# Patient Record
Sex: Female | Born: 1995 | Hispanic: Yes | Marital: Single | Smoking: Current some day smoker
Health system: Southern US, Community
[De-identification: ages and names within clinical notes are randomized; demographics above are authoritative.]

## PROBLEM LIST (undated history)

## (undated) DIAGNOSIS — F32A Depression, unspecified: Secondary | ICD-10-CM

## (undated) DIAGNOSIS — F603 Borderline personality disorder: Secondary | ICD-10-CM

## (undated) DIAGNOSIS — F329 Major depressive disorder, single episode, unspecified: Secondary | ICD-10-CM

## (undated) DIAGNOSIS — J45909 Unspecified asthma, uncomplicated: Secondary | ICD-10-CM

---
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
## 2019-06-10 ENCOUNTER — Encounter: Payer: Self-pay | Admitting: Emergency Medicine

## 2019-06-10 ENCOUNTER — Other Ambulatory Visit: Payer: Self-pay

## 2019-06-10 DIAGNOSIS — R0789 Other chest pain: Secondary | ICD-10-CM | POA: Insufficient documentation

## 2019-06-10 DIAGNOSIS — R072 Precordial pain: Secondary | ICD-10-CM | POA: Diagnosis not present

## 2019-06-10 DIAGNOSIS — R079 Chest pain, unspecified: Secondary | ICD-10-CM | POA: Diagnosis not present

## 2019-06-10 DIAGNOSIS — J45909 Unspecified asthma, uncomplicated: Secondary | ICD-10-CM | POA: Insufficient documentation

## 2019-06-10 DIAGNOSIS — R0602 Shortness of breath: Secondary | ICD-10-CM | POA: Diagnosis not present

## 2019-06-10 LAB — COMPREHENSIVE METABOLIC PANEL
ALT: 49 U/L — ABNORMAL HIGH (ref 0–44)
AST: 29 U/L (ref 15–41)
Albumin: 4 g/dL (ref 3.5–5.0)
Alkaline Phosphatase: 43 U/L (ref 38–126)
Anion gap: 8 (ref 5–15)
BUN: 7 mg/dL (ref 6–20)
CO2: 24 mmol/L (ref 22–32)
Calcium: 9 mg/dL (ref 8.9–10.3)
Chloride: 104 mmol/L (ref 98–111)
Creatinine, Ser: 0.53 mg/dL (ref 0.44–1.00)
GFR calc Af Amer: >60 mL/min (ref >60)
GFR calc non Af Amer: >60 mL/min (ref >60)
Glucose, Bld: 94 mg/dL (ref 70–99)
Potassium: 3.9 mmol/L (ref 3.5–5.1)
Sodium: 136 mmol/L (ref 135–145)
Total Bilirubin: 0.6 mg/dL (ref 0.3–1.2)
Total Protein: 7.4 g/dL (ref 6.5–8.1)

## 2019-06-10 LAB — CBC
HCT: 35.9 % — ABNORMAL LOW (ref 36.0–46.0)
Hemoglobin: 12.2 g/dL (ref 12.0–15.0)
MCH: 29.3 pg (ref 26.0–34.0)
MCHC: 34 g/dL (ref 30.0–36.0)
MCV: 86.3 fL (ref 80.0–100.0)
Platelets: 479 10*3/uL — ABNORMAL HIGH (ref 150–400)
RBC: 4.16 MIL/uL (ref 3.87–5.11)
RDW: 12.5 % (ref 11.5–15.5)
WBC: 9 10*3/uL (ref 4.0–10.5)
nRBC: 0 % (ref 0.0–0.2)

## 2019-06-10 LAB — TROPONIN I (HIGH SENSITIVITY): Troponin I (High Sensitivity): <2 ng/L (ref <18)

## 2019-06-10 NOTE — ED Triage Notes (Signed)
Pt in with co chest pain that radiates to upper back, denies any injury. Pt states started 4 days ago, hx of asthma but states feels different. Pt states does have some shob, no cough, no fever.

## 2019-06-11 ENCOUNTER — Emergency Department: Payer: BC Managed Care – PPO

## 2019-06-11 ENCOUNTER — Emergency Department
Admission: EM | Admit: 2019-06-11 | Discharge: 2019-06-11 | Disposition: A | Discharge status: Home / Self Care | Payer: BC Managed Care – PPO | Attending: Student in an Organized Health Care Education/Training Program | Admitting: Student in an Organized Health Care Education/Training Program

## 2019-06-11 DIAGNOSIS — R079 Chest pain, unspecified: Secondary | ICD-10-CM

## 2019-06-11 DIAGNOSIS — R0602 Shortness of breath: Secondary | ICD-10-CM | POA: Diagnosis not present

## 2019-06-11 HISTORY — DX: Unspecified asthma, uncomplicated: J45.909

## 2019-06-11 LAB — FIBRIN DERIVATIVES D-DIMER (ARMC ONLY): Fibrin derivatives D-dimer (ARMC): 279.44 ng/mL (FEU) (ref 0.00–499.00)

## 2019-06-11 MED ORDER — CYCLOBENZAPRINE HCL 5 MG PO TABS: 5.0000 mg | ORAL_TABLET | Freq: Three times a day (TID) | ORAL | 0 refills | Status: DC | PRN

## 2019-06-11 MED ORDER — HYDROCODONE-ACETAMINOPHEN 5-325 MG PO TABS
1.0000 | ORAL_TABLET | Freq: Once | ORAL | Status: AC
  Administered 2019-06-11: 1 via ORAL
  Filled 2019-06-11: qty 1

## 2019-06-11 NOTE — ED Provider Notes (Signed)
[facility] Emergency Department Provider Note    None    (approximate)  I have reviewed the triage vital signs and the nursing notes.   HISTORY  Chief Complaint Chest Pain    HPI Veronica Clay is a 23 y.o. female close past medical history presents the ER for several days of midsternal chest pain that is worse with deep inspiration and motion.  Does feel some shortness of breath.  She works at [employer] but denies any heavy lifting.  She is on birth control.  Denies any history of blood clots.  Has not had any hemoptysis.  No prolonged immobilization.  No fevers cough or congestion.  No nausea or vomiting.    Past Medical History:  Diagnosis Date  . Asthma    No family history on file.  There are no active problems to display for this patient.     Prior to Admission medications   Not on File    Allergies Patient has no known allergies.    Social History Social History   Tobacco Use  . Smoking status: Not on file  Substance Use Topics  . Alcohol use: Not on file  . Drug use: Not on file    Review of Systems Patient denies headaches, rhinorrhea, blurry vision, numbness, shortness of breath, chest pain, edema, cough, abdominal pain, nausea, vomiting, diarrhea, dysuria, fevers, rashes or hallucinations unless otherwise stated above in HPI. ____________________________________________   PHYSICAL EXAM:  VITAL SIGNS: Vitals:   06/10/19 2238  BP: (!) 130/97  Pulse: 86  Resp: 20  Temp: 99.3 F (37.4 C)    Constitutional: Alert and oriented.  Eyes: Conjunctivae are normal.  Head: Atraumatic. Nose: No congestion/rhinnorhea. Mouth/Throat: Mucous membranes are moist.   Neck: No stridor. Painless ROM.  Cardiovascular: Normal rate, regular rhythm. Grossly normal heart sounds.  Good peripheral circulation. Respiratory: Normal respiratory effort.  No retractions. Lungs CTAB. Gastrointestinal: Soft and nontender. No distention.  No abdominal bruits. No CVA tenderness. Genitourinary:  Musculoskeletal: No lower extremity tenderness nor edema.  No joint effusions. Neurologic:  Normal speech and language. No gross focal neurologic deficits are appreciated. No facial droop Skin:  Skin is warm, dry and intact. No rash noted. Psychiatric: Mood and affect are normal. Speech and behavior are normal.  ____________________________________________   LABS (all labs ordered are listed, but only abnormal results are displayed)  Results for orders placed or performed during the hospital encounter of 06/11/19 (from the past 24 hour(s))  CBC     Status: Abnormal   Collection Time: 06/10/19 10:41 PM  Result Value Ref Range   WBC 9.0 4.0 - 10.5 K/uL   RBC 4.16 3.87 - 5.11 MIL/uL   Hemoglobin 12.2 12.0 - 15.0 g/dL   HCT 35.9 (L) 36.0 - 46.0 %   MCV 86.3 80.0 - 100.0 fL   MCH 29.3 26.0 - 34.0 pg   MCHC 34.0 30.0 - 36.0 g/dL   RDW 12.5 11.5 - 15.5 %   Platelets 479 (H) 150 - 400 K/uL   nRBC 0.0 0.0 - 0.2 %  Comprehensive metabolic panel     Status: Abnormal   Collection Time: 06/10/19 10:41 PM  Result Value Ref Range   Sodium 136 135 - 145 mmol/L   Potassium 3.9 3.5 - 5.1 mmol/L   Chloride 104 98 - 111 mmol/L   CO2 24 22 - 32 mmol/L   Glucose, Bld 94 70 - 99 mg/dL   BUN 7 6 - 20  mg/dL   Creatinine, Ser 0.53 0.44 - 1.00 mg/dL   Calcium 9.0 8.9 - 10.3 mg/dL   Total Protein 7.4 6.5 - 8.1 g/dL   Albumin 4.0 3.5 - 5.0 g/dL   AST 29 15 - 41 U/L   ALT 49 (H) 0 - 44 U/L   Alkaline Phosphatase 43 38 - 126 U/L   Total Bilirubin 0.6 0.3 - 1.2 mg/dL   GFR calc non Af Amer >60 >60 mL/min   GFR calc Af Amer >60 >60 mL/min   Anion gap 8 5 - 15  Troponin I (High Sensitivity)     Status: None   Collection Time: 06/10/19 10:41 PM  Result Value Ref Range   Troponin I (High Sensitivity) <2 <18 ng/L   ____________________________________________  EKG My review and personal interpretation at Time: 22:41   Indication: chest pain   Rate: 80  Rhythm: sinus Axis: normal Other: normal intervals, no stemi ____________________________________________  RADIOLOGY  I personally reviewed all radiographic images ordered to evaluate for the above acute complaints and reviewed radiology reports and findings.  These findings were personally discussed with the patient.  Please see medical record for radiology report.  ____________________________________________   PROCEDURES  Procedure(s) performed:  Procedures    Critical Care performed: no ____________________________________________   INITIAL IMPRESSION / ASSESSMENT AND PLAN / ED COURSE  Pertinent labs & imaging results that were available during my care of the patient were reviewed by me and considered in my medical decision making (see chart for details).   DDX: ACS, pericarditis, esophagitis, boerhaaves, pe, dissection, pna, bronchitis, costochondritis   Veronica Clay is a 23 y.o. who presents to the ED with reproducible chest pain as described above.  Patient afebrile hemodynamically stable.  EKG shows no evidence of acute ischemia she is low risk by heart score negative high-sensitivity troponin.  She is low risk by [place].  D-dimer sent to further re-stratify as she is on birth control.  D-dimer is negative.  No evidence of pneumothorax.  Pain is reproducible with palpation and motion most consistent with musculoskeletal strain.  She stable appropriate for outpatient follow-up     The patient was evaluated in Emergency Department today for the symptoms described in the history of present illness. He/she was evaluated in the context of the global COVID-19 pandemic, which necessitated consideration that the patient might be at risk for infection with the SARS-CoV-2 virus that causes COVID-19. Institutional protocols and algorithms that pertain to the evaluation of patients at risk for COVID-19 are in a state of rapid change based on information released by  regulatory bodies including the CDC and federal and state organizations. These policies and algorithms were followed during the patient's care in the ED.   As part of my medical decision making, I reviewed the following data within the [facility] notes reviewed and incorporated, Labs reviewed, notes from prior ED visits and [place] Controlled Substance Database   ____________________________________________   FINAL CLINICAL IMPRESSION(S) / ED DIAGNOSES  Final diagnoses:  Chest pain, unspecified type      NEW MEDICATIONS STARTED DURING THIS VISIT:  New Prescriptions   No medications on file     Note:  This document was prepared using Dragon voice recognition software and may include unintentional dictation errors.    Merlyn Lot, MD 06/11/19 [phone]

## 2019-06-29 ENCOUNTER — Other Ambulatory Visit: Payer: Self-pay

## 2019-06-29 ENCOUNTER — Encounter: Payer: Self-pay | Admitting: Emergency Medicine

## 2019-06-29 ENCOUNTER — Emergency Department
Admission: EM | Admit: 2019-06-29 | Discharge: 2019-06-30 | Disposition: A | Discharge status: Other Acute Inpatient Hospital | Payer: BC Managed Care – PPO | Attending: Emergency Medicine | Admitting: Emergency Medicine

## 2019-06-29 DIAGNOSIS — J45909 Unspecified asthma, uncomplicated: Secondary | ICD-10-CM | POA: Diagnosis not present

## 2019-06-29 DIAGNOSIS — F329 Major depressive disorder, single episode, unspecified: Secondary | ICD-10-CM

## 2019-06-29 DIAGNOSIS — R45851 Suicidal ideations: Secondary | ICD-10-CM | POA: Diagnosis not present

## 2019-06-29 DIAGNOSIS — F319 Bipolar disorder, unspecified: Secondary | ICD-10-CM | POA: Insufficient documentation

## 2019-06-29 DIAGNOSIS — Z046 Encounter for general psychiatric examination, requested by authority: Secondary | ICD-10-CM | POA: Diagnosis not present

## 2019-06-29 DIAGNOSIS — Z03818 Encounter for observation for suspected exposure to other biological agents ruled out: Secondary | ICD-10-CM | POA: Diagnosis not present

## 2019-06-29 DIAGNOSIS — F17200 Nicotine dependence, unspecified, uncomplicated: Secondary | ICD-10-CM | POA: Insufficient documentation

## 2019-06-29 DIAGNOSIS — F32A Depression, unspecified: Secondary | ICD-10-CM

## 2019-06-29 LAB — ACETAMINOPHEN LEVEL: Acetaminophen (Tylenol), Serum: <10 ug/mL — ABNORMAL LOW (ref 10–30)

## 2019-06-29 LAB — URINE DRUG SCREEN, QUALITATIVE (ARMC ONLY)
Amphetamines, Ur Screen: NOT DETECTED
Barbiturates, Ur Screen: NOT DETECTED
Benzodiazepine, Ur Scrn: NOT DETECTED
Cannabinoid 50 Ng, Ur ~~LOC~~: NOT DETECTED
Cocaine Metabolite,Ur ~~LOC~~: NOT DETECTED
MDMA (Ecstasy)Ur Screen: NOT DETECTED
Methadone Scn, Ur: NOT DETECTED
Opiate, Ur Screen: NOT DETECTED
Phencyclidine (PCP) Ur S: NOT DETECTED
Tricyclic, Ur Screen: NOT DETECTED

## 2019-06-29 LAB — CBC
HCT: 38.4 % (ref 36.0–46.0)
Hemoglobin: 13.1 g/dL (ref 12.0–15.0)
MCH: 29.2 pg (ref 26.0–34.0)
MCHC: 34.1 g/dL (ref 30.0–36.0)
MCV: 85.5 fL (ref 80.0–100.0)
Platelets: 492 10*3/uL — ABNORMAL HIGH (ref 150–400)
RBC: 4.49 MIL/uL (ref 3.87–5.11)
RDW: 12 % (ref 11.5–15.5)
WBC: 8.4 10*3/uL (ref 4.0–10.5)
nRBC: 0 % (ref 0.0–0.2)

## 2019-06-29 LAB — COMPREHENSIVE METABOLIC PANEL
ALT: 56 U/L — ABNORMAL HIGH (ref 0–44)
AST: 42 U/L — ABNORMAL HIGH (ref 15–41)
Albumin: 4.2 g/dL (ref 3.5–5.0)
Alkaline Phosphatase: 38 U/L (ref 38–126)
Anion gap: 12 (ref 5–15)
BUN: 8 mg/dL (ref 6–20)
CO2: 22 mmol/L (ref 22–32)
Calcium: 9.2 mg/dL (ref 8.9–10.3)
Chloride: 105 mmol/L (ref 98–111)
Creatinine, Ser: 0.59 mg/dL (ref 0.44–1.00)
GFR calc Af Amer: >60 mL/min (ref >60)
GFR calc non Af Amer: >60 mL/min (ref >60)
Glucose, Bld: 121 mg/dL — ABNORMAL HIGH (ref 70–99)
Potassium: 3.3 mmol/L — ABNORMAL LOW (ref 3.5–5.1)
Sodium: 139 mmol/L (ref 135–145)
Total Bilirubin: 0.6 mg/dL (ref 0.3–1.2)
Total Protein: 7.4 g/dL (ref 6.5–8.1)

## 2019-06-29 LAB — SALICYLATE LEVEL: Salicylate Lvl: <7 mg/dL (ref 2.8–30.0)

## 2019-06-29 LAB — POCT PREGNANCY, URINE: Preg Test, Ur: NEGATIVE

## 2019-06-29 LAB — ETHANOL: Alcohol, Ethyl (B): <10 mg/dL (ref <10)

## 2019-06-29 NOTE — ED Notes (Addendum)
Report to [facility], DR Burr Medico att, pt woken

## 2019-06-29 NOTE — ED Notes (Addendum)
Pt came in with thoughts of overdosing on trazodone or driving her car off/into something. She says she's had SI intermittently for years. Pt denied HI and AVH. She's been calm, pleasant, and cooperative.

## 2019-06-29 NOTE — ED Triage Notes (Signed)
Patient brought in by [employer] for IVC. Patient states that she was having suicidal thoughts. Patient denies SI at this time.

## 2019-06-29 NOTE — ED Provider Notes (Signed)
[facility] Emergency Department Provider Note  Time seen: 9:25 PM  I have reviewed the triage vital signs and the nursing notes.   HISTORY  Chief Complaint Psychiatric Evaluation   HPI Veronica Clay is a 23 y.o. female with a past medical history of depression presents emergency department for suicidal ideation.  According to the patient since she is been 23 years old she has been having thoughts of suicide, attempted to kill herself last year by overdosing on pills.  Per notes patient was seen by psychiatry today, was expressing thoughts of killing herself once again by overdose so they placed the patient under an IVC and brought her to the emergency department.  Here the patient denies any active plan to do so, but states she has been having these thoughts since she was 23 years old.  Denies any drug or alcohol use.  Denies any medical complaints today.  No fever cough congestion or shortness of breath.   Past Medical History:  Diagnosis Date  . Asthma     There are no active problems to display for this patient.   History reviewed. No pertinent surgical history.  Prior to Admission medications   Medication Sig Start Date End Date Taking? Authorizing Provider  cyclobenzaprine (FLEXERIL) 5 MG tablet Take 1 tablet (5 mg total) by mouth 3 (three) times daily as needed for muscle spasms. 06/11/19   Merlyn Lot, MD    No Known Allergies  No family history on file.  Social History Social History   Tobacco Use  . Smoking status: Current Some Day Smoker  . Smokeless tobacco: Never Used  Substance Use Topics  . Alcohol use: Not Currently  . Drug use: Yes    Types: Marijuana    Review of Systems Constitutional: Negative for fever. ENT: Negative for recent illness/congestion Respiratory: Negative for shortness of breath.  Negative for cough. Gastrointestinal: Negative for abdominal pain, vomiting Musculoskeletal: Negative for musculoskeletal  complaints Skin: Negative for skin complaints  Neurological: Negative for headache All other ROS negative  ____________________________________________   PHYSICAL EXAM:  VITAL SIGNS: ED Triage Vitals  Enc Vitals Group     BP 06/29/19 1959 135/82     Pulse Rate 06/29/19 1959 98     Resp 06/29/19 1959 18     Temp 06/29/19 1959 98.6 F (37 C)     Temp Source 06/29/19 1959 Oral     SpO2 06/29/19 1959 98 %     Weight 06/29/19 2001 190 lb (86.2 kg)     Height 06/29/19 2001 5\' 8"  (1.727 m)     Head Circumference --      Peak Flow --      Pain Score 06/29/19 2004 0     Pain Loc --      Pain Edu? --      Excl. in [place]? --    Constitutional: Alert and oriented. Well appearing and in no distress. Eyes: Normal exam ENT      Head: Normocephalic and atraumatic.      Mouth/Throat: Mucous membranes are moist. Cardiovascular: Normal rate, regular rhythm. No murmur Respiratory: Normal respiratory effort without tachypnea nor retractions. Breath sounds are clear  Gastrointestinal: Soft and nontender. No distention.   Musculoskeletal: Nontender with normal range of motion in all extremities. Neurologic:  Normal speech and language. No gross focal neurologic deficits  Skin:  Skin is warm, dry and intact.  Psychiatric: Mood and affect are normal.  States suicidal ideation for the past  10 years but denies any acute increase.  ____________________________________________   INITIAL IMPRESSION / ASSESSMENT AND PLAN / ED COURSE  Pertinent labs & imaging results that were available during my care of the patient were reviewed by me and considered in my medical decision making (see chart for details).   Patient presents emergency department for suicidal ideation placed under IVC by her psychiatrist.  We will maintain the IVC of the patient's knee TTS until he psychiatry.  Patient has no medical complaints.  Lab work is largely reassuring.  Veronica Clay was evaluated in Emergency Department  on 06/29/2019 for the symptoms described in the history of present illness. She was evaluated in the context of the global COVID-19 pandemic, which necessitated consideration that the patient might be at risk for infection with the SARS-CoV-2 virus that causes COVID-19. Institutional protocols and algorithms that pertain to the evaluation of patients at risk for COVID-19 are in a state of rapid change based on information released by regulatory bodies including the CDC and federal and state organizations. These policies and algorithms were followed during the patient's care in the ED.  ____________________________________________   FINAL CLINICAL IMPRESSION(S) / ED DIAGNOSES  Suicidal ideation Depression   Harvest Dark, MD 06/29/19 2127

## 2019-06-29 NOTE — ED Notes (Signed)
Pt dressed out by this tech. Pt has black and white checkered vans, black jeans, black underwear, yellow t-shirt, white socks, samsung phone, white case, sweat pants, water bottle, contacts, contact solution, charger, hair brush, wallet, baby oil, vitamins, sponge bob bag, ear rings, eyebrow, lip jewelry, nipple rings,

## 2019-06-29 NOTE — ED Notes (Signed)
TTS machine in with pt, Veronica Clay called

## 2019-06-30 DIAGNOSIS — F603 Borderline personality disorder: Secondary | ICD-10-CM | POA: Diagnosis not present

## 2019-06-30 DIAGNOSIS — F319 Bipolar disorder, unspecified: Secondary | ICD-10-CM | POA: Diagnosis not present

## 2019-06-30 DIAGNOSIS — F332 Major depressive disorder, recurrent severe without psychotic features: Secondary | ICD-10-CM | POA: Diagnosis not present

## 2019-06-30 DIAGNOSIS — F17210 Nicotine dependence, cigarettes, uncomplicated: Secondary | ICD-10-CM | POA: Diagnosis not present

## 2019-06-30 DIAGNOSIS — F329 Major depressive disorder, single episode, unspecified: Secondary | ICD-10-CM | POA: Diagnosis not present

## 2019-06-30 DIAGNOSIS — Z03818 Encounter for observation for suspected exposure to other biological agents ruled out: Secondary | ICD-10-CM | POA: Diagnosis not present

## 2019-06-30 DIAGNOSIS — F411 Generalized anxiety disorder: Secondary | ICD-10-CM | POA: Diagnosis not present

## 2019-06-30 DIAGNOSIS — F17200 Nicotine dependence, unspecified, uncomplicated: Secondary | ICD-10-CM | POA: Diagnosis not present

## 2019-06-30 DIAGNOSIS — J45909 Unspecified asthma, uncomplicated: Secondary | ICD-10-CM | POA: Diagnosis not present

## 2019-06-30 DIAGNOSIS — R45851 Suicidal ideations: Secondary | ICD-10-CM | POA: Diagnosis not present

## 2019-06-30 DIAGNOSIS — Z046 Encounter for general psychiatric examination, requested by authority: Secondary | ICD-10-CM | POA: Diagnosis not present

## 2019-06-30 LAB — SARS CORONAVIRUS 2 BY RT PCR (HOSPITAL ORDER, PERFORMED IN ~~LOC~~ HOSPITAL LAB): SARS Coronavirus 2: NEGATIVE

## 2019-06-30 NOTE — ED Provider Notes (Signed)
Vitals:   06/30/19 0744 06/30/19 0811  BP: 119/72 120/69  Pulse: 87 86  Resp: 16 15  Temp: 98.4 F (36.9 C) 98.3 F (36.8 C)  SpO2: 100% 100%     Patient resting comfortably.  Fully alert.  Eating breakfast.  In no distress.  Understanding agreeable with trying to be transferred to old [place].  She is stable without distress or concern at the moment.   Delman Kitten, MD 06/30/19 [phone]

## 2019-06-30 NOTE — ED Notes (Signed)
Pt given breakfast tray and informed she would be leaving shortly to go to [employer] via [employer].

## 2019-06-30 NOTE — ED Notes (Signed)
Pt appears to be sleeping, even and unlabored respirations, will continue to monitor

## 2019-06-30 NOTE — ED Provider Notes (Signed)
Patient has been accepted to [facility].  Patient assigned to room [facility] is Dr. Dr. Claudie Revering, MD .  Call report to [phone].  Representative was [employer] .   ER Staff is aware of it:  [facility] ER Secretary  Dr. ER MD  Ena Dawley Patient's Nurse

## 2019-06-30 NOTE — ED Notes (Signed)
Report given to Karl Bales, RN at [facility]; [phone].

## 2019-06-30 NOTE — BH Assessment (Signed)
Assessment Note  Veronica Clay is an 23 y.o. female. Who presents accompanied by [employer] for IVC. Patient states that she was having suicidal thoughts. attempted to kill herself last year by overdosing on pills.  Per notes patient was seen by psychiatry today, was expressing thoughts of killing herself once again by overdose so they placed the patient under an IVC and brought her to the emergency department.  Here the patient denies any active plan to do so, but states that on today she did call 911 while in the [place] parking lot because she was having suicidal thoughts. She reports a recent relocation for [place] and states that he inability to speak with her father trigger her SI. Pt self reports a Hx of Bipolar Disorder and Borderline Personality Disorder. She reports multiple previous suicide attempts and states that prior to relocating she was taking psychiatric medications.   Diagnosis: Bipolar Disorder   Past Medical History:  Past Medical History:  Diagnosis Date  . Asthma     History reviewed. No pertinent surgical history.  Family History: No family history on file.  Social History:  reports that she has been smoking. She has never used smokeless tobacco. She reports previous alcohol use. She reports current drug use. Drug: Marijuana.  Additional Social History:  Alcohol / Drug Use Pain Medications: SEE PTA Prescriptions: SEE PTA Over the Counter: SEE PTA History of alcohol / drug use?: No history of alcohol / drug abuse  CIWA: CIWA-Ar BP: 135/82 Pulse Rate: 98 COWS:    Allergies: No Known Allergies  Home Medications: (Not in a hospital admission)   OB/GYN Status:  Patient's last menstrual period was 06/22/2019 (approximate).  General Assessment Data Location of Assessment: [facility] ED TTS Assessment: In system Is this a Tele or Face-to-Face Assessment?: Face-to-Face Is this an Initial Assessment or a Re-assessment for this encounter?: Initial Assessment Patient  Accompanied by:: N/A Language Other than English: No Living Arrangements: Other (Comment) What gender do you identify as?: Female Pregnancy Status: No Living Arrangements: Other (Comment) Can pt return to current living arrangement?: Yes Admission Status: Involuntary Petitioner: ([facility]) Is patient capable of signing voluntary admission?: No Referral Source: Other  Medical Screening Exam ([place]) Medical Exam completed: Yes  Crisis Care Plan Living Arrangements: Other (Comment) Legal Guardian: Other:(none) Name of Psychiatrist: none  Name of Therapist: none   Education Status Is patient currently in school?: No Is the patient employed, unemployed or receiving disability?: Employed  Risk to self with the past 6 months Suicidal Ideation: No-Not Currently/Within Last 6 Months Has patient been a risk to self within the past 6 months prior to admission? : Yes Suicidal Intent: No-Not Currently/Within Last 6 Months Has patient had any suicidal intent within the past 6 months prior to admission? : Yes Is patient at risk for suicide?: Yes Suicidal Plan?: No-Not Currently/Within Last 6 Months Has patient had any suicidal plan within the past 6 months prior to admission? : Yes Access to Means: No What has been your use of drugs/alcohol within the last 12 months?: none Previous Attempts/Gestures: Yes How many times?: (Multiple ) Other Self Harm Risks: none  Triggers for Past Attempts: Unknown Intentional Self Injurious Behavior: None Family Suicide History: No Recent stressful life event(s): Conflict (Comment), Loss (Comment) Persecutory voices/beliefs?: No Depression: Yes Depression Symptoms: Tearfulness, Feeling worthless/self pity, Loss of interest in usual pleasures Substance abuse history and/or treatment for substance abuse?: No Suicide prevention information given to non-admitted patients: Yes  Risk to Others  within the past 6 months Homicidal Ideation: No Does  patient have any lifetime risk of violence toward others beyond the six months prior to admission? : No Thoughts of Harm to Others: No Current Homicidal Intent: No Current Homicidal Plan: No Access to Homicidal Means: No Identified Victim: none  Assessment of Violence: None Noted Violent Behavior Description: none  Does patient have access to weapons?: No Criminal Charges Pending?: No Does patient have a court date: No Is patient on probation?: No  Psychosis Hallucinations: None noted Delusions: None noted  Mental Status Report Appearance/Hygiene: In scrubs Eye Contact: Good Motor Activity: Freedom of movement Speech: Soft Level of Consciousness: Alert Mood: Anxious Affect: Anxious Anxiety Level: Minimal Thought Processes: Coherent Judgement: Unimpaired Orientation: Person, Place, Time, Situation Obsessive Compulsive Thoughts/Behaviors: None  Cognitive Functioning Concentration: Normal Memory: Recent Intact, Remote Intact Is patient IDD: No Insight: Fair Impulse Control: Fair Appetite: Fair Have you had any weight changes? : No Change Sleep: No Change Total Hours of Sleep: 6 Vegetative Symptoms: None  ADLScreening [facility] Assessment Services) Patient's cognitive ability adequate to safely complete daily activities?: Yes Patient able to express need for assistance with ADLs?: Yes Independently performs ADLs?: Yes (appropriate for developmental age)  Prior Inpatient Therapy Prior Inpatient Therapy: Yes Prior Therapy Dates: 2019 Prior Therapy Facilty/Provider(s): Out of state Reason for Treatment: SI   Prior Outpatient Therapy Prior Outpatient Therapy: No Does patient have an ACCT team?: No Does patient have Intensive In-House Services?  : No Does patient have [facility] services? : No Does patient have P4CC services?: No  ADL Screening (condition at time of admission) Patient's cognitive ability adequate to safely complete daily activities?: Yes Patient able to  express need for assistance with ADLs?: Yes Independently performs ADLs?: Yes (appropriate for developmental age)       Abuse/Neglect Assessment (Assessment to be complete while patient is alone) Abuse/Neglect Assessment Can Be Completed: Yes Physical Abuse: Denies Verbal Abuse: Denies Sexual Abuse: Denies Exploitation of patient/patient's resources: Denies Self-Neglect: Denies Values / Beliefs Cultural Requests During Hospitalization: None Spiritual Requests During Hospitalization: None Consults Spiritual Care Consult Needed: No Social Work Consult Needed: No Regulatory affairs officer (For Healthcare) Does Patient Have a Medical Advance Directive?: No          Disposition:  Disposition Initial Assessment Completed for this Encounter: Yes Patient referred to: Other (Comment)(SOC Consult )  On Site Evaluation by:   Reviewed with Physician:    Laretta Alstrom 06/30/2019 12:12 AM

## 2019-06-30 NOTE — ED Notes (Signed)
Attempted to call [facility], no answer.

## 2019-06-30 NOTE — ED Provider Notes (Signed)
-----------------------------------------   12:41 AM on 06/30/2019 -----------------------------------------  Patient was evaluated by [facility] psychiatrist Dr. Roosevelt Locks who recommends inpatient psychiatry admission and maintaining IVC.  No new medication recommendations.   Paulette Blanch, MD 06/30/19 [phone]

## 2019-06-30 NOTE — ED Notes (Signed)
[facility]  DEPT  CALLED  FOR  TRANSPORT

## 2019-06-30 NOTE — ED Notes (Signed)
Pt walking fast through hallway, directed back to room, reports to this RN "you haven't told me anything, I've haven't been able to see anyone, I just want to go home"  Pt again redirected to reason for treatment plan and pt's part in that.    Nutrition and medicine offered to help pt sleep again offered, pt refused, lights off and TV off as pt requested  Dr Beather Arbour notified

## 2019-06-30 NOTE — ED Notes (Signed)
EMTALA reviewed by charge RN 

## 2019-06-30 NOTE — ED Notes (Signed)
Pt repeatedly oriented to [place]; pt directed to a positive control and experience of admission; pt reports that I'm ignoring her and "I haven't seen anyone"

## 2019-07-15 DIAGNOSIS — F331 Major depressive disorder, recurrent, moderate: Secondary | ICD-10-CM | POA: Diagnosis not present

## 2019-07-15 DIAGNOSIS — F603 Borderline personality disorder: Secondary | ICD-10-CM | POA: Diagnosis not present

## 2019-08-01 DIAGNOSIS — F331 Major depressive disorder, recurrent, moderate: Secondary | ICD-10-CM | POA: Diagnosis not present

## 2019-08-01 DIAGNOSIS — F17210 Nicotine dependence, cigarettes, uncomplicated: Secondary | ICD-10-CM | POA: Diagnosis not present

## 2019-08-01 DIAGNOSIS — F603 Borderline personality disorder: Secondary | ICD-10-CM | POA: Diagnosis not present

## 2019-08-29 DIAGNOSIS — F17210 Nicotine dependence, cigarettes, uncomplicated: Secondary | ICD-10-CM | POA: Diagnosis not present

## 2019-08-29 DIAGNOSIS — F603 Borderline personality disorder: Secondary | ICD-10-CM | POA: Diagnosis not present

## 2019-08-29 DIAGNOSIS — F331 Major depressive disorder, recurrent, moderate: Secondary | ICD-10-CM | POA: Diagnosis not present

## 2019-10-24 DIAGNOSIS — F603 Borderline personality disorder: Secondary | ICD-10-CM | POA: Diagnosis not present

## 2019-10-24 DIAGNOSIS — F1210 Cannabis abuse, uncomplicated: Secondary | ICD-10-CM | POA: Diagnosis not present

## 2019-10-24 DIAGNOSIS — F331 Major depressive disorder, recurrent, moderate: Secondary | ICD-10-CM | POA: Diagnosis not present

## 2019-10-24 DIAGNOSIS — F17201 Nicotine dependence, unspecified, in remission: Secondary | ICD-10-CM | POA: Diagnosis not present

## 2019-11-16 DIAGNOSIS — Z20828 Contact with and (suspected) exposure to other viral communicable diseases: Secondary | ICD-10-CM | POA: Diagnosis not present

## 2019-11-21 DIAGNOSIS — F331 Major depressive disorder, recurrent, moderate: Secondary | ICD-10-CM | POA: Diagnosis not present

## 2020-01-05 DIAGNOSIS — Z20828 Contact with and (suspected) exposure to other viral communicable diseases: Secondary | ICD-10-CM | POA: Diagnosis not present

## 2020-01-30 DIAGNOSIS — Z20828 Contact with and (suspected) exposure to other viral communicable diseases: Secondary | ICD-10-CM | POA: Diagnosis not present

## 2020-04-15 DIAGNOSIS — Z20822 Contact with and (suspected) exposure to covid-19: Secondary | ICD-10-CM | POA: Diagnosis not present

## 2020-04-15 DIAGNOSIS — Z20828 Contact with and (suspected) exposure to other viral communicable diseases: Secondary | ICD-10-CM | POA: Diagnosis not present

## 2020-06-14 DIAGNOSIS — Z03818 Encounter for observation for suspected exposure to other biological agents ruled out: Secondary | ICD-10-CM | POA: Diagnosis not present

## 2020-06-14 DIAGNOSIS — Z20822 Contact with and (suspected) exposure to covid-19: Secondary | ICD-10-CM | POA: Diagnosis not present

## 2020-07-15 ENCOUNTER — Other Ambulatory Visit: Payer: Self-pay | Admitting: Obstetrics & Gynecology

## 2020-07-15 ENCOUNTER — Emergency Department
Admission: EM | Admit: 2020-07-15 | Discharge: 2020-07-15 | Disposition: A | Discharge status: Home / Self Care | Payer: BC Managed Care – PPO | Attending: Emergency Medicine | Admitting: Emergency Medicine

## 2020-07-15 ENCOUNTER — Emergency Department: Payer: BC Managed Care – PPO

## 2020-07-15 ENCOUNTER — Other Ambulatory Visit: Payer: Self-pay

## 2020-07-15 DIAGNOSIS — F17200 Nicotine dependence, unspecified, uncomplicated: Secondary | ICD-10-CM | POA: Diagnosis not present

## 2020-07-15 DIAGNOSIS — R1031 Right lower quadrant pain: Secondary | ICD-10-CM | POA: Diagnosis not present

## 2020-07-15 DIAGNOSIS — D270 Benign neoplasm of right ovary: Secondary | ICD-10-CM | POA: Diagnosis present

## 2020-07-15 DIAGNOSIS — N83291 Other ovarian cyst, right side: Secondary | ICD-10-CM | POA: Diagnosis not present

## 2020-07-15 DIAGNOSIS — D489 Neoplasm of uncertain behavior, unspecified: Secondary | ICD-10-CM

## 2020-07-15 DIAGNOSIS — D279 Benign neoplasm of unspecified ovary: Secondary | ICD-10-CM | POA: Diagnosis not present

## 2020-07-15 DIAGNOSIS — J45909 Unspecified asthma, uncomplicated: Secondary | ICD-10-CM | POA: Diagnosis not present

## 2020-07-15 DIAGNOSIS — R110 Nausea: Secondary | ICD-10-CM | POA: Insufficient documentation

## 2020-07-15 LAB — URINALYSIS, COMPLETE (UACMP) WITH MICROSCOPIC
Bacteria, UA: NONE SEEN
Bilirubin Urine: NEGATIVE
Glucose, UA: NEGATIVE mg/dL
Ketones, ur: NEGATIVE mg/dL
Leukocytes,Ua: NEGATIVE
Nitrite: NEGATIVE
Protein, ur: NEGATIVE mg/dL
Specific Gravity, Urine: 1.01 (ref 1.005–1.030)
WBC, UA: NONE SEEN WBC/hpf (ref 0–5)
pH: 7 (ref 5.0–8.0)

## 2020-07-15 LAB — COMPREHENSIVE METABOLIC PANEL
ALT: 32 U/L (ref 0–44)
AST: 24 U/L (ref 15–41)
Albumin: 4.2 g/dL (ref 3.5–5.0)
Alkaline Phosphatase: 47 U/L (ref 38–126)
Anion gap: 11 (ref 5–15)
BUN: 12 mg/dL (ref 6–20)
CO2: 22 mmol/L (ref 22–32)
Calcium: 9.3 mg/dL (ref 8.9–10.3)
Chloride: 106 mmol/L (ref 98–111)
Creatinine, Ser: 0.64 mg/dL (ref 0.44–1.00)
GFR calc Af Amer: >60 mL/min (ref >60)
GFR calc non Af Amer: >60 mL/min (ref >60)
Glucose, Bld: 93 mg/dL (ref 70–99)
Potassium: 3.7 mmol/L (ref 3.5–5.1)
Sodium: 139 mmol/L (ref 135–145)
Total Bilirubin: 0.5 mg/dL (ref 0.3–1.2)
Total Protein: 7.7 g/dL (ref 6.5–8.1)

## 2020-07-15 LAB — CBC
HCT: 36.7 % (ref 36.0–46.0)
Hemoglobin: 12.9 g/dL (ref 12.0–15.0)
MCH: 29.8 pg (ref 26.0–34.0)
MCHC: 35.1 g/dL (ref 30.0–36.0)
MCV: 84.8 fL (ref 80.0–100.0)
Platelets: 488 10*3/uL — ABNORMAL HIGH (ref 150–400)
RBC: 4.33 MIL/uL (ref 3.87–5.11)
RDW: 12.2 % (ref 11.5–15.5)
WBC: 7.2 10*3/uL (ref 4.0–10.5)
nRBC: 0 % (ref 0.0–0.2)

## 2020-07-15 LAB — POCT PREGNANCY, URINE: Preg Test, Ur: NEGATIVE

## 2020-07-15 LAB — HCG, QUANTITATIVE, PREGNANCY: hCG, Beta Chain, Quant, S: <1 m[IU]/mL (ref <5)

## 2020-07-15 LAB — LIPASE, BLOOD: Lipase: 26 U/L (ref 11–51)

## 2020-07-15 MED ORDER — ONDANSETRON 4 MG PO TBDP
4.0000 mg | ORAL_TABLET | Freq: Once | ORAL | Status: AC
  Administered 2020-07-15: 4 mg via ORAL
  Filled 2020-07-15: qty 1

## 2020-07-15 MED ORDER — IOHEXOL 300 MG/ML  SOLN
100.0000 mL | Freq: Once | INTRAMUSCULAR | Status: AC | PRN
  Administered 2020-07-15: 100 mL via INTRAVENOUS
  Filled 2020-07-15: qty 100

## 2020-07-15 MED ORDER — LACTATED RINGERS IV BOLUS
1000.0000 mL | Freq: Once | INTRAVENOUS | Status: AC
  Administered 2020-07-15: 1000 mL via INTRAVENOUS

## 2020-07-15 MED ORDER — HYDROMORPHONE HCL 1 MG/ML IJ SOLN
1.0000 mg | Freq: Once | INTRAMUSCULAR | Status: AC
  Administered 2020-07-15: 1 mg via INTRAVENOUS
  Filled 2020-07-15: qty 1

## 2020-07-15 MED ORDER — OXYCODONE-ACETAMINOPHEN 5-325 MG PO TABS: 1.0000 | ORAL_TABLET | ORAL | 0 refills | Status: DC | PRN

## 2020-07-15 MED ORDER — SODIUM CHLORIDE 0.9% FLUSH
3.0000 mL | Freq: Once | INTRAVENOUS | Status: AC
  Administered 2020-07-15: 3 mL via INTRAVENOUS

## 2020-07-15 NOTE — ED Notes (Signed)
See triage note  Presents with RLQ pain this weeks  States has had  intermittent pain  But this week pain has been constant  Pain increases with ambulation

## 2020-07-15 NOTE — ED Notes (Signed)
Pt to US at this time.

## 2020-07-15 NOTE — ED Triage Notes (Signed)
Pt arrives via POV for reports of RLQ pain x "months". Pt reports it does not happen daily but this past week the pain has been more persistent. Pt in NAD, skin warm and dry. Denies dysuria

## 2020-07-15 NOTE — ED Provider Notes (Signed)
[facility] Emergency Department Provider Note  ____________________________________________   First MD Initiated Contact with Patient 07/15/20 1304     (approximate)  I have reviewed the triage vital signs and the nursing notes.   HISTORY  Chief Complaint Abdominal Pain   HPI Veronica Clay is a 24 y.o. female with a past medical history of asthma who presents for assessment approximately 3 to 4 days of worsening right lower quadrant pain described as crampy and severely exacerbated by walking.  Patient states she has had pain in this area before intermittently over the last couple months but has never lasted this long or been this severe.  She versus some nausea but denies any headache, earache, sore throat, fevers, vomiting, diarrhea, dysuria, blood in her stool, blood in her urine, vaginal discharge, lower pelvic pain, back pain, rash, or other acute complaints.  She does note she took Plan B 2 days ago after unprotected intercourse.  Other than walking no clear alleviating aggravating factors including Tylenol.  No prior similar episodes.  Patient denies EtOH use, illegal drug use aside from [facility], or tobacco abuse.        Past Medical History:  Diagnosis Date  . Asthma     Patient Active Problem List   Diagnosis Date Noted  . Cyst, ovary, dermoid, right 07/15/2020    History reviewed. No pertinent surgical history.  Prior to Admission medications   Medication Sig Start Date End Date Taking? Authorizing Provider  oxyCODONE-acetaminophen (PERCOCET) 5-325 MG tablet Take 1 tablet by mouth every 4 (four) hours as needed for moderate pain or severe pain. 07/15/20   Gae Dry, MD    Allergies Patient has no known allergies.  History reviewed. No pertinent family history.  Social History Social History   Tobacco Use  . Smoking status: Current Some Day Smoker  . Smokeless tobacco: Never Used  Substance Use Topics  . Alcohol use: Not  Currently  . Drug use: Yes    Types: Marijuana    Review of Systems  Review of Systems  Constitutional: Negative for chills and fever.  HENT: Negative for sore throat.   Eyes: Negative for pain.  Respiratory: Negative for cough and stridor.   Cardiovascular: Negative for chest pain.  Gastrointestinal: Positive for abdominal pain and nausea. Negative for vomiting.  Skin: Negative for rash.  Neurological: Negative for seizures, loss of consciousness and headaches.  Psychiatric/Behavioral: Negative for suicidal ideas.  All other systems reviewed and are negative.     ____________________________________________   PHYSICAL EXAM:  VITAL SIGNS: ED Triage Vitals  Enc Vitals Group     BP 07/15/20 1223 (!) 126/95     Pulse Rate 07/15/20 1223 77     Resp 07/15/20 1223 18     Temp 07/15/20 1223 99 F (37.2 C)     Temp Source 07/15/20 1223 Oral     SpO2 07/15/20 1223 100 %     Weight 07/15/20 1224 190 lb (86.2 kg)     Height 07/15/20 1224 5\' 7"  (1.702 m)     Head Circumference --      Peak Flow --      Pain Score 07/15/20 1224 2     Pain Loc --      Pain Edu? --      Excl. in [place]? --    Vitals:   07/15/20 1223  BP: (!) 126/95  Pulse: 77  Resp: 18  Temp: 99 F (37.2 C)  SpO2: 100%  Physical Exam Vitals and nursing note reviewed.  Constitutional:      General: She is not in acute distress.    Appearance: She is well-developed.  HENT:     Head: Normocephalic and atraumatic.     Right Ear: External ear normal.     Left Ear: External ear normal.     Nose: Nose normal.     Mouth/Throat:     Mouth: Mucous membranes are moist.  Eyes:     Conjunctiva/sclera: Conjunctivae normal.  Cardiovascular:     Rate and Rhythm: Normal rate and regular rhythm.     Heart sounds: No murmur heard.   Pulmonary:     Effort: Pulmonary effort is normal. No respiratory distress.     Breath sounds: Normal breath sounds.  Abdominal:     Palpations: Abdomen is soft.     Tenderness:  There is abdominal tenderness in the right lower quadrant, periumbilical area and suprapubic area. There is no right CVA tenderness or left CVA tenderness.  Musculoskeletal:     Cervical back: Neck supple.  Skin:    General: Skin is warm and dry.     Capillary Refill: Capillary refill takes less than 2 seconds.  Neurological:     General: No focal deficit present.     Mental Status: She is alert.  Psychiatric:        Mood and Affect: Mood normal.      ____________________________________________   LABS (all labs ordered are listed, but only abnormal results are displayed)  Labs Reviewed  CBC - Abnormal; Notable for the following components:      Result Value   Platelets 488 (*)    All other components within normal limits  URINALYSIS, COMPLETE (UACMP) WITH MICROSCOPIC - Abnormal; Notable for the following components:   Color, Urine STRAW (*)    APPearance CLEAR (*)    Hgb urine dipstick SMALL (*)    All other components within normal limits  LIPASE, BLOOD  COMPREHENSIVE METABOLIC PANEL  HCG, QUANTITATIVE, PREGNANCY  POC URINE PREG, ED  POCT PREGNANCY, URINE  I-STAT BETA HCG BLOOD, ED ([facility], [facility], [facility] ONLY)   ____________________________________________ ____________________________________________  RADIOLOGY   Official radiology report(s): CT ABDOMEN PELVIS W CONTRAST  Result Date: 07/15/2020 CLINICAL DATA:  24 year old female with right lower quadrant abdominal pain. EXAM: CT ABDOMEN AND PELVIS WITH CONTRAST TECHNIQUE: Multidetector CT imaging of the abdomen and pelvis was performed using the standard protocol following bolus administration of intravenous contrast. CONTRAST:  126mL OMNIPAQUE IOHEXOL 300 MG/ML  SOLN COMPARISON:  None. FINDINGS: Lower chest: The visualized lung bases are clear. No intra-abdominal free air or free fluid. Hepatobiliary: Probable mild fatty liver. No intrahepatic biliary ductal dilatation. The gallbladder is unremarkable. Pancreas: Unremarkable. No  pancreatic ductal dilatation or surrounding inflammatory changes. Spleen: Normal in size without focal abnormality. Adrenals/Urinary Tract: The adrenal glands unremarkable. There is mild bilateral hydronephrosis likely related to mass effect and compression of the distal ureters by cystic pelvic mass. The urinary bladder is unremarkable. Stomach/Bowel: There is moderate stool throughout the colon. There is no bowel obstruction or active inflammation. The appendix is normal. Vascular/Lymphatic: The abdominal aorta and IVC are unremarkable. No portal venous gas. There is no adenopathy. Reproductive: The uterus is grossly unremarkable. There is a large predominantly cystic mass arising from the pelvis measuring 11 x 19 cm in greatest axial dimensions and 21 cm in craniocaudal length. There is a small focus of fat and calcification anterior to this cyst (75/2). 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 Further characterization with MRI without and with contrast is recommended. Other: None Musculoskeletal: Indeterminate 2 x 3 cm sclerotic focus involving the intertrochanteric ridge of the left femur. No periosteal elevation or cortical destruction. No associated soft tissue mass. No acute osseous pathology. IMPRESSION: 1. Large ovarian cystic teratoma. Further characterization with MRI without and with contrast is recommended. 2. No bowel obstruction. Normal appendix. 3. Indeterminate 2 x 3 cm sclerotic focus without aggressive features involving the intertrochanteric ridge of the left femur. Direct comparison with prior images, if available recommended. If no prior images are available further characterization with MRI is advised. Electronically Signed   By: Anner Crete M.D.   On: 07/15/2020 17:56   US PELVIC COMPLETE W TRANSVAGINAL AND TORSION R/O  Result Date: 07/15/2020 CLINICAL DATA:  Possible teratoma on recent CT examination EXAM: TRANSABDOMINAL AND TRANSVAGINAL ULTRASOUND OF PELVIS DOPPLER  ULTRASOUND OF OVARIES TECHNIQUE: Both transabdominal and transvaginal ultrasound examinations of the pelvis were performed. Transabdominal technique was performed for global imaging of the pelvis including uterus, ovaries, adnexal regions, and pelvic cul-de-sac. It was necessary to proceed with endovaginal exam following the transabdominal exam to visualize the ovaries. Color and duplex Doppler ultrasound was utilized to evaluate blood flow to the ovaries. COMPARISON:  CT from earlier in the same day. FINDINGS: Uterus Measurements: 8.2 x 4.2 x 5.0 cm. = volume: 90 mL. No fibroids or other mass visualized. Endometrium Thickness: 10 mm.  No focal abnormality visualized. Right ovary Normal adnexal tissue on the right is not appreciated. Large cystic mass measuring up to 20 cm is noted predominately simple in nature. This corresponds to that seen on the prior CT examination. The fatty and calcific component seen on recent CT are not well appreciated on today's exam. Left ovary Measurements: 3.4 x 2.5 x 2.4 cm. = volume: 11 mL. Normal appearance/no adnexal mass. Pulsed Doppler evaluation of both ovaries demonstrates normal low-resistance arterial and venous waveforms. Other findings No abnormal free fluid. IMPRESSION: Large right adnexal cystic mass similar to that seen on recent CT examination. The fatty and calcific component seen on CT are not well appreciated on this exam. The need for nonemergent MRI workup can be determined on a clinical basis. Surgical consultation is recommended. Electronically Signed   By: Inez Catalina M.D.   On: 07/15/2020 19:12    ____________________________________________   PROCEDURES  Procedure(s) performed (including Critical Care):  Procedures   ____________________________________________   INITIAL IMPRESSION / ASSESSMENT AND PLAN / ED COURSE        Oral patient's history, exam, and ED work-up is most concerning for symptoms originating from large teratoma noted  above CT without evidence of torsion on above-noted Doppler imaging.  No other evidence on CT of appendicitis, pyonephritis, kidney stone, diarrhea colitis, or other acute intra-abdominal process.  UA does not appear consistent with cystitis and there are no significant electrolyte or metabolic derangements noted on CBC or CMP.  Given the large tumor noted on CT OB/GYN service was consulted who did come and evaluate the patient in the emergency room.  Below noted analgesia given with some improvement on reassessment.  Per OB/GYN patient is stable for discharge with plan for close outpatient follow-up and likely operative treatment.  OB/GYN consultants wrote Rx for Percocet.  Patient was discharged in stable condition.  Strict return precautions provided in writing.  Medications  sodium chloride flush (NS) 0.9 % injection 3 mL (3 mLs Intravenous Given 07/15/20 1913)  lactated  ringers bolus 1,000 mL (0 mLs Intravenous Stopped 07/15/20 1602)  iohexol (OMNIPAQUE) 300 MG/ML solution 100 mL (100 mLs Intravenous Contrast Given 07/15/20 1725)  HYDROmorphone (DILAUDID) injection 1 mg (1 mg Intravenous Given 07/15/20 1910)         ____________________________________________   FINAL CLINICAL IMPRESSION(S) / ED DIAGNOSES  Final diagnoses:  Teratoma     ED Discharge Orders    None       Note:  This document was prepared using Dragon voice recognition software and may include unintentional dictation errors.   Lucrezia Starch, MD 07/15/20 2036

## 2020-07-15 NOTE — Discharge Instructions (Signed)
Ovarian Cyst     An ovarian cyst is a fluid-filled sac that forms on an ovary. The ovaries are small organs that produce eggs in women. Various types of cysts can form on the ovaries. Some may cause symptoms and require treatment. Most ovarian cysts go away on their own, are not cancerous (are benign), and do not cause problems. Common types of ovarian cysts include:  Functional (follicle) cysts. ? Occur during the menstrual cycle, and usually go away with the next menstrual cycle if you do not get pregnant. ? Usually cause no symptoms.  Endometriomas. ? Are cysts that form from the tissue that lines the uterus (endometrium). ? Are sometimes called "chocolate cysts" because they become filled with blood that turns brown. ? Can cause pain in the lower abdomen during intercourse and during your period.  Cystadenoma cysts. ? Develop from cells on the outside surface of the ovary. ? Can get very large and cause lower abdomen pain and pain with intercourse. ? Can cause severe pain if they twist or break open (rupture).  Dermoid cysts. ? Are sometimes found in both ovaries. ? May contain different kinds of body tissue, such as skin, teeth, hair, or cartilage. ? Usually do not cause symptoms unless they get very big.  Theca lutein cysts. ? Occur when too much of a certain hormone (human chorionic gonadotropin) is produced and overstimulates the ovaries to produce an egg. ? Are most common after having procedures used to assist with the conception of a baby (in vitro fertilization). What are the causes? Ovarian cysts may be caused by:  Ovarian hyperstimulation syndrome. This is a condition that can develop from taking fertility medicines. It causes multiple large ovarian cysts to form.  Polycystic ovarian syndrome (PCOS). This is a common hormonal disorder that can cause ovarian cysts, as well as problems with your period or fertility. What increases the risk? The following factors may  make you more likely to develop ovarian cysts:  Being overweight or obese.  Taking fertility medicines.  Taking certain forms of hormonal birth control.  Smoking. What are the signs or symptoms? Many ovarian cysts do not cause symptoms. If symptoms are present, they may include:  Pelvic pain or pressure.  Pain in the lower abdomen.  Pain during sex.  Abdominal swelling.  Abnormal menstrual periods.  Increasing pain with menstrual periods. How is this diagnosed? These cysts are commonly found during a routine pelvic exam. You may have tests to find out more about the cyst, such as:  Ultrasound.  X-ray of the pelvis.  CT scan.  MRI.  Blood tests. How is this treated? Many ovarian cysts go away on their own without treatment. Your health care provider may want to check your cyst regularly for 2-3 months to see if it changes. If you are in menopause, it is especially important to have your cyst monitored closely because menopausal women have a higher rate of ovarian cancer. When treatment is needed, it may include:  Medicines to help relieve pain.  A procedure to drain the cyst (aspiration).  Surgery to remove the whole cyst.  Hormone treatment or birth control pills. These methods are sometimes used to help dissolve a cyst. Follow these instructions at home:  Take over-the-counter and prescription medicines only as told by your health care provider.  Do not drive or use heavy machinery while taking prescription pain medicine.  Get regular pelvic exams and Pap tests as often as told by your health care provider.    Return to your normal activities as told by your health care provider. Ask your health care provider what activities are safe for you.  Do not use any products that contain nicotine or tobacco, such as cigarettes and e-cigarettes. If you need help quitting, ask your health care provider.  Keep all follow-up visits as told by your health care provider.  This is important. Contact a health care provider if:  Your periods are late, irregular, or painful, or they stop.  You have pelvic pain that does not go away.  You have pressure on your bladder or trouble emptying your bladder completely.  You have pain during sex.  You have any of the following in your abdomen: ? A feeling of fullness. ? Pressure. ? Discomfort. ? Pain that does not go away. ? Swelling.  You feel generally ill.  You become constipated.  You lose your appetite.  You develop severe acne.  You start to have more body hair and facial hair.  You are gaining weight or losing weight without changing your exercise and eating habits.  You think you may be pregnant. Get help right away if:  You have abdominal pain that is severe or gets worse.  You cannot eat or drink without vomiting.  You suddenly develop a fever.  Your menstrual period is much heavier than usual. This information is not intended to replace advice given to you by your health care provider. Make sure you discuss any questions you have with your health care provider. Document Revised: 03/04/2018 Document Reviewed: 05/07/2016 Elsevier Patient Education  2020 Elsevier Inc.  

## 2020-07-15 NOTE — ED Notes (Signed)
Pt provided with water. Pt UA to provide UA at this time.

## 2020-07-15 NOTE — H&P (Signed)
Obstetrics & Gynecology History and Physical Note  Date of Consultation: 07/15/2020   Requesting Provider: [facility] ER  Primary OBGYN: None Primary Care Provider: Patient, No Pcp Per  Reason for Consultation: Right lower quadrant pain  History of Present Illness: Ms. Veronica Clay is a 24 y.o. G64 female w reg cycles on OCPs, with the above CC. She has has mild pain for a few months, but recently noted worsening RLQ pain with no radiation, severe at times (9/10), associated with nausea at times, no change in bowel or bladder habits, no change in periods, modifiers include rest and today narcotics in ER, no other context.  No prior h/o cysts, gyn dx, pregnancy, PCOS, other.  ROS: A review of systems was performed and was complete and comprehensive, except as stated in the above HPI.  OBGYN History: As per HPI. OB History   No obstetric history on file.      Past Medical History: Past Medical History:  Diagnosis Date  . Asthma     Past Surgical History: History reviewed. No pertinent surgical history.  Family History:  History reviewed. No pertinent family history. She denies any female cancers, bleeding or blood clotting disorders.   Social History:  Social History   Socioeconomic History  . Marital status: Single    Spouse name: Not on file  . Number of children: Not on file  . Years of education: Not on file  . Highest education level: Not on file  Occupational History  . Not on file  Tobacco Use  . Smoking status: Current Some Day Smoker  . Smokeless tobacco: Never Used  Substance and Sexual Activity  . Alcohol use: Not Currently  . Drug use: Yes    Types: Marijuana  . Sexual activity: Not on file  Other Topics Concern  . Not on file  Social History Narrative  . Not on file   Social Determinants of Health   Financial Resource Strain:   . Difficulty of Paying Living Expenses:   Food Insecurity:   . Worried About Charity fundraiser in the Last Year:   . Arts development officer in the Last Year:   Transportation Needs:   . Film/video editor (Medical):   Marland Kitchen Lack of Transportation (Non-Medical):   Physical Activity:   . Days of Exercise per Week:   . Minutes of Exercise per Session:   Stress:   . Feeling of Stress :   Social Connections:   . Frequency of Communication with Friends and Family:   . Frequency of Social Gatherings with Friends and Family:   . Attends Religious Services:   . Active Member of Clubs or Organizations:   . Attends Archivist Meetings:   Marland Kitchen Marital Status:   Intimate Partner Violence:   . Fear of Current or Ex-Partner:   . Emotionally Abused:   Marland Kitchen Physically Abused:   . Sexually Abused:     Allergy: No Known Allergies  Current Outpatient Medications: (Not in a hospital admission)   Hospital Medications: No current facility-administered medications for this encounter.   Current Outpatient Medications  Medication Sig Dispense Refill  . oxyCODONE-acetaminophen (PERCOCET) 5-325 MG tablet Take 1 tablet by mouth every 4 (four) hours as needed for moderate pain or severe pain. 30 tablet 0    Physical Exam: Vitals:   07/15/20 1223 07/15/20 1224  BP: (!) 126/95   Pulse: 77   Resp: 18   Temp: 99 F (37.2 C)   TempSrc: Oral  SpO2: 100%   Weight:  86.2 kg  Height:  5\' 7"  (1.702 m)    Temp:  [99 F (37.2 C)] 99 F (37.2 C) (07/29 1223) Pulse Rate:  [77] 77 (07/29 1223) Resp:  [18] 18 (07/29 1223) BP: (126)/(95) 126/95 (07/29 1223) SpO2:  [100 %] 100 % (07/29 1223) Weight:  [86.2 kg] 86.2 kg (07/29 1224) No intake/output data recorded. No intake/output data recorded. No intake or output data in the 24 hours ending 07/15/20 2023  Body mass index is 29.76 kg/m. Constitutional: Well nourished, well developed female in no acute distress.  HEENT: normal Neck:  Supple, normal appearance, and no thyromegaly  Cardiovascular:Regular rate and rhythm.  No murmurs, rubs or gallops. Respiratory:   Clear to auscultation bilateral. Normal respiratory effort Abdomen: positive bowel sounds and no masses, hernias; diffusely non tender to palpation EXCEPT RLQ T to deep palpation, non distended Neuro: grossly intact Psych:  Normal mood and affect.  Skin:  Warm and dry.  MS: normal gait and normal bilateral lower extremity strength/ROM/symmetry Lymphatic:  No inguinal lymphadenopathy.   Recent Labs  Lab 07/15/20 1226  WBC 7.2  HGB 12.9  HCT 36.7  PLT 488*   Recent Labs  Lab 07/15/20 1226  NA 139  K 3.7  CL 106  CO2 22  BUN 12  CREATININE 0.64  CALCIUM 9.3  PROT 7.7  BILITOT 0.5  ALKPHOS 47  ALT 32  AST 24  GLUCOSE 93   Imaging:  Ultrasound independently reviewed/interpreted by self. CT as well. 19 cm teratoma on right.  Right ovary Normal adnexal tissue on the right is not appreciated. Large cystic mass measuring up to 20 cm is noted predominately simple in nature. This corresponds to that seen on the prior CT examination. The fatty and calcific component seen on recent CT are not well appreciated on today's exam.  Left ovary Measurements: 3.4 x 2.5 x 2.4 cm. = volume: 11 mL. Normal appearance/no adnexal mass.  Pulsed Doppler evaluation of both ovaries demonstrates normal low-resistance arterial and venous waveforms.  Assessment: Ms. Veronica Clay is a 24 y.o. G0 who presented to the ED with complaints of RLQ pain; findings are consistent with right dermoid cyst. No signs of torsion.  Plan: Monitor pain. Plan surgery for cystectomy possible oophorectomy as an outpatient (possibly next week) Percocet for home pain medicine Out of work today, in future based on pain Risks of dermoids, torsion, surgery, recovery, recurrence all d/w pt and significant other. Pt will be discharged from the ER and follow up in office soon  Barnett Applebaum, MD, [place], [place] 07/15/2020  8:23 PM Pager [phone]

## 2020-07-16 ENCOUNTER — Telehealth: Payer: Self-pay | Admitting: Obstetrics & Gynecology

## 2020-07-20 ENCOUNTER — Encounter
Admission: RE | Admit: 2020-07-20 | Discharge: 2020-07-20 | Disposition: A | Discharge status: Home / Self Care | Payer: BC Managed Care – PPO | Source: Ambulatory Visit | Attending: Obstetrics & Gynecology | Admitting: Obstetrics & Gynecology

## 2020-07-20 ENCOUNTER — Ambulatory Visit (INDEPENDENT_AMBULATORY_CARE_PROVIDER_SITE_OTHER): Payer: BC Managed Care – PPO | Admitting: Obstetrics & Gynecology

## 2020-07-20 ENCOUNTER — Encounter: Payer: Self-pay | Admitting: Obstetrics & Gynecology

## 2020-07-20 ENCOUNTER — Other Ambulatory Visit: Payer: Self-pay

## 2020-07-20 VITALS — BP 120/80 | Ht 67.0 in | Wt 192.0 lb

## 2020-07-20 DIAGNOSIS — R102 Pelvic and perineal pain unspecified side: Secondary | ICD-10-CM

## 2020-07-20 DIAGNOSIS — N83201 Unspecified ovarian cyst, right side: Secondary | ICD-10-CM

## 2020-07-20 DIAGNOSIS — D270 Benign neoplasm of right ovary: Secondary | ICD-10-CM

## 2020-07-20 HISTORY — DX: Borderline personality disorder: F60.3

## 2020-07-20 HISTORY — DX: Depression, unspecified: F32.A

## 2020-07-20 NOTE — H&P (View-Only) (Signed)
HISTORY AND PHYSICAL EXAM  HPI:  Veronica Clay is a 24 y.o. G53 female.  Patient's last menstrual period was 07/03/2020.; having pain related to cyst on right ovary.  She has has mild pain for a few months, but recently noted worsening RLQ pain with no radiation, severe at times (9/10), associated with nausea at times, no change in bowel or bladder habits, no change in periods, modifiers include rest and today narcotics in ER, no other context.  No prior h/o cysts, gyn dx, pregnancy, PCOS, other.  Still reporting pain even today.  US showed a 19 cm right sided likely teratoma/ adnexal mass.  Doppler blood flow was normal at that time.  She is being admitted for surgery related to adnexal mass.  PMHx: Past Medical History:  Diagnosis Date   Asthma    History reviewed. No pertinent surgical history. History reviewed. No pertinent family history. Social History   Tobacco Use   Smoking status: Current Some Day Smoker   Smokeless tobacco: Never Used  Substance Use Topics   Alcohol use: Not Currently   Drug use: Yes    Types: Marijuana    Current Outpatient Medications:    SPRINTEC 28 0.25-35 MG-MCG tablet, Take 1 tablet by mouth daily., Disp: , Rfl:    acetaminophen (TYLENOL) 500 MG tablet, Take 1,000 mg by mouth every 6 (six) hours as needed for moderate pain or headache. (Patient not taking: Reported on 07/20/2020), Disp: , Rfl:    Bismuth Subsalicylate (KAOPECTATE) 262 MG TABS, Take 262 mg by mouth daily as needed (indigestion). (Patient not taking: Reported on 07/20/2020), Disp: , Rfl:    FIBER PO, Take 2-3 capsules by mouth daily as needed (constipation). (Patient not taking: Reported on 07/20/2020), Disp: , Rfl:    hydrocortisone cream 1 %, Apply 1 application topically 2 (two) times daily as needed (eczema). (Patient not taking: Reported on 07/20/2020), Disp: , Rfl:    ibuprofen (ADVIL) 200 MG tablet, Take 200-400 mg by mouth every 6 (six) hours as needed for headache  or moderate pain. (Patient not taking: Reported on 07/20/2020), Disp: , Rfl:    loratadine (CLARITIN) 10 MG tablet, Take 10 mg by mouth daily. (Patient not taking: Reported on 07/20/2020), Disp: , Rfl:    naproxen sodium (PAMPRIN ALL DAY RELIEF MAX ST) 220 MG tablet, Take 220 mg by mouth daily as needed (cramps). (Patient not taking: Reported on 07/20/2020), Disp: , Rfl:    oxyCODONE-acetaminophen (PERCOCET) 5-325 MG tablet, Take 1 tablet by mouth every 4 (four) hours as needed for moderate pain or severe pain. (Patient not taking: Reported on 07/20/2020), Disp: 30 tablet, Rfl: 0 Allergies: Shellfish allergy  Review of Systems  Constitutional: Negative for chills, fever and malaise/fatigue.  HENT: Negative for congestion, sinus pain and sore throat.   Eyes: Negative for blurred vision and pain.  Respiratory: Negative for cough and wheezing.   Cardiovascular: Negative for chest pain and leg swelling.  Gastrointestinal: Positive for abdominal pain. Negative for constipation, diarrhea, heartburn, nausea and vomiting.  Genitourinary: Negative for dysuria, frequency, hematuria and urgency.  Musculoskeletal: Negative for back pain, joint pain, myalgias and neck pain.  Skin: Negative for itching and rash.  Neurological: Negative for dizziness, tremors and weakness.  Endo/Heme/Allergies: Does not bruise/bleed easily.  Psychiatric/Behavioral: Negative for depression. The patient is not nervous/anxious and does not have insomnia.     Objective: BP 120/80    Ht 5\' 7"  (1.702 m)    Wt 192 lb (  87.1 kg)    LMP 07/03/2020    BMI 30.07 kg/m   Filed Weights   07/20/20 1122  Weight: 192 lb (87.1 kg)   Physical Exam Constitutional:      General: She is not in acute distress.    Appearance: She is well-developed.  HENT:     Head: Normocephalic and atraumatic. No laceration.     Right Ear: Hearing normal.     Left Ear: Hearing normal.     Mouth/Throat:     Pharynx: Uvula midline.  Eyes:     Pupils:  Pupils are equal, round, and reactive to light.  Neck:     Thyroid: No thyromegaly.  Cardiovascular:     Rate and Rhythm: Normal rate and regular rhythm.     Heart sounds: No murmur heard.  No friction rub. No gallop.   Pulmonary:     Effort: Pulmonary effort is normal. No respiratory distress.     Breath sounds: Normal breath sounds. No wheezing.  Chest:     Breasts:        Right: No mass, skin change or tenderness.        Left: No mass, skin change or tenderness.  Abdominal:     General: Bowel sounds are normal. There is no distension.     Palpations: Abdomen is soft.     Tenderness: There is no abdominal tenderness. There is no rebound.  Musculoskeletal:        General: Normal range of motion.     Cervical back: Normal range of motion and neck supple.  Neurological:     Mental Status: She is alert and oriented to person, place, and time.     Cranial Nerves: No cranial nerve deficit.  Skin:    General: Skin is warm and dry.  Psychiatric:        Judgment: Judgment normal.  Vitals reviewed.    Assessment: 1. Right ovarian cyst   2. Teratoma of right ovary   3. Pelvic pain   Risk of torsion, rupture, growth of cyst, pain discussed, recommend removal. Plan Laparotomy, Cystectomy, possible Oophorectomy Pt counseled as to the pros and cons of oophorectomy if necessary.  I have had a careful discussion with this patient about all the options available and the risk/benefits of each. I have fully informed this patient that surgery may subject her to a variety of discomforts and risks: She understands that most patients have surgery with little difficulty, but problems can happen ranging from minor to fatal. These include nausea, vomiting, pain, bleeding, infection, poor healing, hernia, or formation of adhesions. Unexpected reactions may occur from any drug or anesthetic given. Unintended injury may occur to other pelvic or abdominal structures such as Fallopian tubes, ovaries,  bladder, ureter (tube from kidney to bladder), or bowel. Nerves going from the pelvis to the legs may be injured. Any such injury may require immediate or later additional surgery to correct the problem. Excessive blood loss requiring transfusion is very unlikely but possible. Dangerous blood clots may form in the legs or lungs. Physical and sexual activity will be restricted in varying degrees for an indeterminate period of time but most often 2-6 weeks.  Finally, she understands that it is impossible to list every possible undesirable effect and that the condition for which surgery is done is not always cured or significantly improved, and in rare cases may be even worse.Ample time was given to answer all questions.  Barnett Applebaum, MD, [facility], [facility] 07/20/2020  11:25 AM

## 2020-07-20 NOTE — Patient Instructions (Signed)
PRE ADMISSION TESTING For Covid, prior to procedure Wednesday 9:00-10:00 [facility] entrance (drive up)  Results in 48-72 hours You will not receive notification if test results are negative. If positive for Covid19, your provider will notify you by phone, with additional instructions.    Ovarian Cystectomy, Care After This sheet gives you information about how to care for yourself after your procedure. Your health care provider may also give you more specific instructions. If you have problems or questions, contact your health care provider. What can I expect after the procedure? After the procedure, it is common to have:  Pain in the abdomen, especially at the incision areas. You will be given pain medicines to control the pain.  Tiredness. This is a normal part of the recovery process. Your energy level will return to normal over the next several weeks. Follow these instructions at home: Medicines  Take over-the-counter and prescription medicines only as told by your health care provider.  If you were prescribed an antibiotic medicine, use it as told by your health care provider. Do not stop using the antibiotic even if you start to feel better.  Do not take aspirin because it can cause bleeding.  Ask your health care provider if the medicine prescribed to you: ? Requires you to avoid driving or using heavy machinery. ? Can cause constipation. You may need to take these actions to prevent or treat constipation:  Drink enough fluid to keep your urine pale yellow.  Take over-the-counter or prescription medicines.  Eat foods that are high in fiber, such as beans, whole grains, and fresh fruits and vegetables.  Limit foods that are high in fat and processed sugars, such as fried or sweet foods. Incision care   Follow instructions from your health care provider about how to take care of your incisions. Make sure you: ? Wash your hands with soap and water before and  after you change your bandage (dressing). If soap and water are not available, use hand sanitizer. ? Change your dressing as told by your health care provider. ? Leave stitches (sutures), skin glue, or adhesive strips in place. These skin closures may need to stay in place for 2 weeks or longer. If adhesive strip edges start to loosen and curl up, you may trim the loose edges. Do not remove adhesive strips completely unless your health care provider tells you to do that.  Check your incision areas every day for signs of infection. Check for: ? Redness, swelling, or pain. ? Fluid or blood. ? Warmth. ? Pus or a bad smell.  Do not take baths, swim, or use a hot tub until your health care provider approves. Ask your health care provider if you may take showers. You may only be allowed to take sponge baths. Activity  Rest as told by your health care provider.  Avoid sitting for a long time without moving. Get up to take short walks every 1-2 hours. This is important to improve blood flow and breathing. Ask for help if you feel weak or unsteady.  Do not lift anything that is heavier than 10 lb (4.5 kg), or the limit that you are told, until your health care provider says that it is safe.  Return to your normal activities and diet as told by your health care provider. Ask your health care provider what activities are safe for you. General instructions  Do not douche, use tampons, or have sexual intercourse until your health care provider says it is  okay to do so.  Do not use any products that contain nicotine or tobacco, such as cigarettes, e-cigarettes, and chewing tobacco. These can delay incision healing after surgery. If you need help quitting, ask your health care provider.  Keep all follow-up visits as told by your health care provider. This is important. Contact a health care provider if:  You have a fever.  You feel nauseous or you vomit.  You have pain when you urinate or have  blood in your urine.  You have a rash on your body.  You have pain or redness where the IV was inserted.  You have pain that is not relieved with medicine.  You have any of these signs of infection: ? Redness, swelling, or pain around your incisions. ? Fluid or blood coming from your incisions. ? Warmth coming from an incision. ? Pus or a bad smell coming from your incisions. Get help right away if:  You have chest pain or shortness of breath.  You feel dizzy or light-headed.  You have heavy bleeding.  You have increasing abdominal pain that is not relieved with medicines.  You have pain, swelling, or redness in your leg.  Your incision is opening (the edges are not staying together). Summary  After the procedure, it is common to have some pain in your abdomen. You will be given pain medicines to control the pain.  Follow instructions from your health care provider about how to take care of your incisions.  Do not douche, use tampons, or have sexual intercourse until your health care provider says it is okay to do so.  Keep all follow-up visits as told by your health care provider. This is important. This information is not intended to replace advice given to you by your health care provider. Make sure you discuss any questions you have with your health care provider. Document Revised: 07/03/2019 Document Reviewed: 07/03/2019 Elsevier Patient Education  [place].

## 2020-07-20 NOTE — Progress Notes (Signed)
HISTORY AND PHYSICAL EXAM  HPI:  Veronica Clay is a 24 y.o. G21 female.  Patient's last menstrual period was 07/03/2020.; having pain related to cyst on right ovary.  She has has mild pain for a few months, but recently noted worsening RLQ pain with no radiation, severe at times (9/10), associated with nausea at times, no change in bowel or bladder habits, no change in periods, modifiers include rest and today narcotics in ER, no other context.  No prior h/o cysts, gyn dx, pregnancy, PCOS, other.  Still reporting pain even today.  US showed a 19 cm right sided likely teratoma/ adnexal mass.  Doppler blood flow was normal at that time.  She is being admitted for surgery related to adnexal mass.  PMHx: Past Medical History:  Diagnosis Date  . Asthma    History reviewed. No pertinent surgical history. History reviewed. No pertinent family history. Social History   Tobacco Use  . Smoking status: Current Some Day Smoker  . Smokeless tobacco: Never Used  Substance Use Topics  . Alcohol use: Not Currently  . Drug use: Yes    Types: Marijuana    Current Outpatient Medications:  .  SPRINTEC 28 0.25-35 MG-MCG tablet, Take 1 tablet by mouth daily., Disp: , Rfl:  .  acetaminophen (TYLENOL) 500 MG tablet, Take 1,000 mg by mouth every 6 (six) hours as needed for moderate pain or headache. (Patient not taking: Reported on 07/20/2020), Disp: , Rfl:  .  Bismuth Subsalicylate (KAOPECTATE) 262 MG TABS, Take 262 mg by mouth daily as needed (indigestion). (Patient not taking: Reported on 07/20/2020), Disp: , Rfl:  .  FIBER PO, Take 2-3 capsules by mouth daily as needed (constipation). (Patient not taking: Reported on 07/20/2020), Disp: , Rfl:  .  hydrocortisone cream 1 %, Apply 1 application topically 2 (two) times daily as needed (eczema). (Patient not taking: Reported on 07/20/2020), Disp: , Rfl:  .  ibuprofen (ADVIL) 200 MG tablet, Take 200-400 mg by mouth every 6 (six) hours as needed for headache  or moderate pain. (Patient not taking: Reported on 07/20/2020), Disp: , Rfl:  .  loratadine (CLARITIN) 10 MG tablet, Take 10 mg by mouth daily. (Patient not taking: Reported on 07/20/2020), Disp: , Rfl:  .  naproxen sodium (PAMPRIN ALL DAY RELIEF MAX ST) 220 MG tablet, Take 220 mg by mouth daily as needed (cramps). (Patient not taking: Reported on 07/20/2020), Disp: , Rfl:  .  oxyCODONE-acetaminophen (PERCOCET) 5-325 MG tablet, Take 1 tablet by mouth every 4 (four) hours as needed for moderate pain or severe pain. (Patient not taking: Reported on 07/20/2020), Disp: 30 tablet, Rfl: 0 Allergies: Shellfish allergy  Review of Systems  Constitutional: Negative for chills, fever and malaise/fatigue.  HENT: Negative for congestion, sinus pain and sore throat.   Eyes: Negative for blurred vision and pain.  Respiratory: Negative for cough and wheezing.   Cardiovascular: Negative for chest pain and leg swelling.  Gastrointestinal: Positive for abdominal pain. Negative for constipation, diarrhea, heartburn, nausea and vomiting.  Genitourinary: Negative for dysuria, frequency, hematuria and urgency.  Musculoskeletal: Negative for back pain, joint pain, myalgias and neck pain.  Skin: Negative for itching and rash.  Neurological: Negative for dizziness, tremors and weakness.  Endo/Heme/Allergies: Does not bruise/bleed easily.  Psychiatric/Behavioral: Negative for depression. The patient is not nervous/anxious and does not have insomnia.     Objective: BP 120/80   Ht 5\' 7"  (1.702 m)   Wt 192 lb (87.1 kg)  LMP 07/03/2020   BMI 30.07 kg/m   Filed Weights   07/20/20 1122  Weight: 192 lb (87.1 kg)   Physical Exam Constitutional:      General: She is not in acute distress.    Appearance: She is well-developed.  HENT:     Head: Normocephalic and atraumatic. No laceration.     Right Ear: Hearing normal.     Left Ear: Hearing normal.     Mouth/Throat:     Pharynx: Uvula midline.  Eyes:     Pupils:  Pupils are equal, round, and reactive to light.  Neck:     Thyroid: No thyromegaly.  Cardiovascular:     Rate and Rhythm: Normal rate and regular rhythm.     Heart sounds: No murmur heard.  No friction rub. No gallop.   Pulmonary:     Effort: Pulmonary effort is normal. No respiratory distress.     Breath sounds: Normal breath sounds. No wheezing.  Chest:     Breasts:        Right: No mass, skin change or tenderness.        Left: No mass, skin change or tenderness.  Abdominal:     General: Bowel sounds are normal. There is no distension.     Palpations: Abdomen is soft.     Tenderness: There is no abdominal tenderness. There is no rebound.  Musculoskeletal:        General: Normal range of motion.     Cervical back: Normal range of motion and neck supple.  Neurological:     Mental Status: She is alert and oriented to person, place, and time.     Cranial Nerves: No cranial nerve deficit.  Skin:    General: Skin is warm and dry.  Psychiatric:        Judgment: Judgment normal.  Vitals reviewed.    Assessment: 1. Right ovarian cyst   2. Teratoma of right ovary   3. Pelvic pain   Risk of torsion, rupture, growth of cyst, pain discussed, recommend removal. Plan Laparotomy, Cystectomy, possible Oophorectomy Pt counseled as to the pros and cons of oophorectomy if necessary.  I have had a careful discussion with this patient about all the options available and the risk/benefits of each. I have fully informed this patient that surgery may subject her to a variety of discomforts and risks: She understands that most patients have surgery with little difficulty, but problems can happen ranging from minor to fatal. These include nausea, vomiting, pain, bleeding, infection, poor healing, hernia, or formation of adhesions. Unexpected reactions may occur from any drug or anesthetic given. Unintended injury may occur to other pelvic or abdominal structures such as Fallopian tubes, ovaries,  bladder, ureter (tube from kidney to bladder), or bowel. Nerves going from the pelvis to the legs may be injured. Any such injury may require immediate or later additional surgery to correct the problem. Excessive blood loss requiring transfusion is very unlikely but possible. Dangerous blood clots may form in the legs or lungs. Physical and sexual activity will be restricted in varying degrees for an indeterminate period of time but most often 2-6 weeks.  Finally, she understands that it is impossible to list every possible undesirable effect and that the condition for which surgery is done is not always cured or significantly improved, and in rare cases may be even worse.Ample time was given to answer all questions.  Barnett Applebaum, MD, [facility], [facility] 07/20/2020  11:25 AM

## 2020-07-20 NOTE — Patient Instructions (Signed)
Your procedure is scheduled on: 07/22/20 Report to [place]. To find out your arrival time please call [phone] between 1PM - 3PM on 07/21/20.  Remember: Instructions that are not followed completely may result in serious medical risk, up to and including death, or upon the discretion of your surgeon and anesthesiologist your surgery may need to be rescheduled.     _X__ 1. Do not eat food after midnight the night before your procedure.                 No gum chewing or hard candies. You may drink clear liquids up to 2 hours                 before you are scheduled to arrive for your surgery- DO not drink clear                 liquids within 2 hours of the start of your surgery.                 Clear Liquids include:  water, apple juice without pulp, clear carbohydrate                 drink such as Clearfast or Gatorade, Black Coffee or Tea (Do not add                 anything to coffee or tea). Diabetics water only  __X__2.  On the morning of surgery brush your teeth with toothpaste and water, you                 may rinse your mouth with mouthwash if you wish.  Do not swallow any              toothpaste of mouthwash.     _X__ 3.  No Alcohol for 24 hours before or after surgery.   _X__ 4.  Do Not Smoke or use e-cigarettes For 24 Hours Prior to Your Surgery.                 Do not use any chewable tobacco products for at least 6 hours prior to                 surgery.  ____  5.  Bring all medications with you on the day of surgery if instructed.   __X__  6.  Notify your doctor if there is any change in your medical condition      (cold, fever, infections).     Do not wear jewelry, make-up, hairpins, clips or nail polish. Do not wear lotions, powders, or perfumes.  Do not shave 48 hours prior to surgery. Men may shave face and neck. Do not bring valuables to the hospital.    [facility] is not responsible for any belongings or  valuables.  Contacts, dentures/partials or body piercings may not be worn into surgery. Bring a case for your contacts, glasses or hearing aids, a denture cup will be supplied. Leave your suitcase in the car. After surgery it may be brought to your room. For patients admitted to the hospital, discharge time is determined by your treatment team.   Patients discharged the day of surgery will not be allowed to drive home.   Please read over the following fact sheets that you were given:   MRSA Information  __X__ Take these medicines the morning of surgery with A SIP OF WATER:  1. none  2.   3.   4.  5.  6.  ____ Fleet Enema (as directed)   __X__ Use CHG Soap/SAGE wipes as directed  ____ Use inhalers on the day of surgery  ____ Stop metformin/Janumet/Farxiga 2 days prior to surgery    ____ Take 1/2 of usual insulin dose the night before surgery. No insulin the morning          of surgery.   ____ Stop Blood Thinners Coumadin/Plavix/Xarelto/Pleta/Pradaxa/Eliquis/Effient/Aspirin  on   Or contact your Surgeon, Cardiologist or Medical Doctor regarding  ability to stop your blood thinners  __X__ Stop Anti-inflammatories 7 days before surgery such as Advil, Ibuprofen, Motrin,  BC or Goodies Powder, Naprosyn, Naproxen, Aleve, Aspirin    __X__ Stop all herbal supplements, fish oil or vitamin E until after surgery.    ____ Bring C-Pap to the hospital.

## 2020-07-21 ENCOUNTER — Telehealth: Payer: Self-pay | Admitting: Obstetrics & Gynecology

## 2020-07-21 ENCOUNTER — Other Ambulatory Visit
Admission: RE | Admit: 2020-07-21 | Discharge: 2020-07-21 | Disposition: A | Discharge status: Home / Self Care | Payer: BC Managed Care – PPO | Source: Ambulatory Visit | Attending: Obstetrics & Gynecology | Admitting: Obstetrics & Gynecology

## 2020-07-21 DIAGNOSIS — Z20822 Contact with and (suspected) exposure to covid-19: Secondary | ICD-10-CM | POA: Insufficient documentation

## 2020-07-21 DIAGNOSIS — Z01812 Encounter for preprocedural laboratory examination: Secondary | ICD-10-CM | POA: Diagnosis not present

## 2020-07-21 LAB — TYPE AND SCREEN
ABO/RH(D): O POS
Antibody Screen: NEGATIVE

## 2020-07-21 LAB — SARS CORONAVIRUS 2 (TAT 6-24 HRS): SARS Coronavirus 2: NEGATIVE

## 2020-07-21 MED ORDER — SODIUM CHLORIDE 0.9 % IV SOLN
2.0000 g | INTRAVENOUS | Status: AC
  Administered 2020-07-22: 2 g via INTRAVENOUS

## 2020-07-21 NOTE — Telephone Encounter (Signed)
Pt called and adv that she went to bathroom and had bleeding. She wanted to let Kenton Kingfisher know in the event it affected her surgery scheduled for tomorrow. She adv that it may be her period but wasn't sure.  She adv that LMP was 7/17 and that she also took Plan B around 7/22 (not sure of exact date).  I spoke with Kenton Kingfisher and he said that it didn't affect surgery. They would only postpone in the event of a + preg test (to be done at check in tomorrow).  I called pt back to adv that bleeding didn't affect surgery.  She also inquired if FLMA paperwork had been completed yet. I adv that I did not see any notation in her chart.

## 2020-07-22 ENCOUNTER — Telehealth: Payer: Self-pay | Admitting: Obstetrics & Gynecology

## 2020-07-22 ENCOUNTER — Encounter: Payer: Self-pay | Admitting: Obstetrics & Gynecology

## 2020-07-22 ENCOUNTER — Ambulatory Visit: Payer: BC Managed Care – PPO | Admitting: Certified Registered Nurse Anesthetist

## 2020-07-22 ENCOUNTER — Encounter
Admission: RE | Disposition: A | Discharge status: Home / Self Care | Payer: Self-pay | Source: Home / Self Care | Attending: Obstetrics & Gynecology

## 2020-07-22 ENCOUNTER — Other Ambulatory Visit: Payer: Self-pay

## 2020-07-22 ENCOUNTER — Observation Stay
Admission: RE | Admit: 2020-07-22 | Discharge: 2020-07-24 | Disposition: A | Discharge status: Home / Self Care | Payer: BC Managed Care – PPO | Attending: Obstetrics & Gynecology | Admitting: Obstetrics & Gynecology

## 2020-07-22 DIAGNOSIS — Z79899 Other long term (current) drug therapy: Secondary | ICD-10-CM | POA: Diagnosis not present

## 2020-07-22 DIAGNOSIS — N83201 Unspecified ovarian cyst, right side: Secondary | ICD-10-CM | POA: Diagnosis present

## 2020-07-22 DIAGNOSIS — F17200 Nicotine dependence, unspecified, uncomplicated: Secondary | ICD-10-CM | POA: Insufficient documentation

## 2020-07-22 DIAGNOSIS — R1909 Other intra-abdominal and pelvic swelling, mass and lump: Secondary | ICD-10-CM | POA: Diagnosis not present

## 2020-07-22 DIAGNOSIS — D271 Benign neoplasm of left ovary: Principal | ICD-10-CM | POA: Insufficient documentation

## 2020-07-22 DIAGNOSIS — N83202 Unspecified ovarian cyst, left side: Secondary | ICD-10-CM

## 2020-07-22 DIAGNOSIS — R1900 Intra-abdominal and pelvic swelling, mass and lump, unspecified site: Secondary | ICD-10-CM

## 2020-07-22 DIAGNOSIS — D279 Benign neoplasm of unspecified ovary: Secondary | ICD-10-CM | POA: Diagnosis not present

## 2020-07-22 DIAGNOSIS — Z9889 Other specified postprocedural states: Secondary | ICD-10-CM

## 2020-07-22 DIAGNOSIS — R1031 Right lower quadrant pain: Secondary | ICD-10-CM | POA: Diagnosis not present

## 2020-07-22 DIAGNOSIS — D270 Benign neoplasm of right ovary: Secondary | ICD-10-CM | POA: Diagnosis not present

## 2020-07-22 DIAGNOSIS — J45909 Unspecified asthma, uncomplicated: Secondary | ICD-10-CM | POA: Insufficient documentation

## 2020-07-22 HISTORY — PX: LAPAROTOMY: SHX154

## 2020-07-22 HISTORY — PX: OOPHORECTOMY: SHX6387

## 2020-07-22 HISTORY — PX: OVARIAN CYST REMOVAL: SHX89

## 2020-07-22 LAB — URINE DRUG SCREEN, QUALITATIVE (ARMC ONLY)
Amphetamines, Ur Screen: NOT DETECTED
Barbiturates, Ur Screen: NOT DETECTED
Benzodiazepine, Ur Scrn: NOT DETECTED
Cannabinoid 50 Ng, Ur ~~LOC~~: POSITIVE — AB
Cocaine Metabolite,Ur ~~LOC~~: NOT DETECTED
MDMA (Ecstasy)Ur Screen: NOT DETECTED
Methadone Scn, Ur: NOT DETECTED
Opiate, Ur Screen: NOT DETECTED
Phencyclidine (PCP) Ur S: NOT DETECTED
Tricyclic, Ur Screen: NOT DETECTED

## 2020-07-22 LAB — POCT PREGNANCY, URINE: Preg Test, Ur: NEGATIVE

## 2020-07-22 LAB — ABO/RH: ABO/RH(D): O POS

## 2020-07-22 SURGERY — LAPAROTOMY, EXPLORATORY
Anesthesia: General | Laterality: Right

## 2020-07-22 MED ORDER — FENTANYL CITRATE (PF) 100 MCG/2ML IJ SOLN
INTRAMUSCULAR | Status: AC
  Administered 2020-07-22: 25 ug via INTRAVENOUS
  Filled 2020-07-22: qty 2

## 2020-07-22 MED ORDER — SODIUM CHLORIDE 0.9 % IV SOLN
INTRAVENOUS | Status: AC
  Filled 2020-07-22: qty 2

## 2020-07-22 MED ORDER — PROPOFOL 10 MG/ML IV BOLUS
INTRAVENOUS | Status: AC
  Filled 2020-07-22: qty 20

## 2020-07-22 MED ORDER — SENNOSIDES-DOCUSATE SODIUM 8.6-50 MG PO TABS
1.0000 | ORAL_TABLET | Freq: Every evening | ORAL | Status: DC | PRN
  Administered 2020-07-23: 1 via ORAL
  Filled 2020-07-22: qty 1

## 2020-07-22 MED ORDER — LIDOCAINE HCL (PF) 2 % IJ SOLN
INTRAMUSCULAR | Status: AC
  Filled 2020-07-22: qty 5

## 2020-07-22 MED ORDER — FAMOTIDINE 20 MG PO TABS: 20.0000 mg | ORAL_TABLET | Freq: Once | ORAL | Status: AC

## 2020-07-22 MED ORDER — ROCURONIUM BROMIDE 10 MG/ML (PF) SYRINGE
PREFILLED_SYRINGE | INTRAVENOUS | Status: AC
  Filled 2020-07-22: qty 10

## 2020-07-22 MED ORDER — ORAL CARE MOUTH RINSE: 15.0000 mL | Freq: Once | OROMUCOSAL | Status: AC

## 2020-07-22 MED ORDER — BELLADONNA ALKALOIDS-OPIUM 16.2-60 MG RE SUPP
RECTAL | Status: DC | PRN
  Administered 2020-07-22: 1 via RECTAL

## 2020-07-22 MED ORDER — ONDANSETRON HCL 4 MG PO TABS
4.0000 mg | ORAL_TABLET | Freq: Four times a day (QID) | ORAL | Status: DC | PRN
  Administered 2020-07-22 – 2020-07-23 (×2): 4 mg via ORAL
  Filled 2020-07-22 (×2): qty 1

## 2020-07-22 MED ORDER — OXYCODONE HCL 5 MG PO TABS
5.0000 mg | ORAL_TABLET | ORAL | Status: DC | PRN
  Administered 2020-07-22 – 2020-07-24 (×6): 10 mg via ORAL
  Filled 2020-07-22 (×6): qty 2

## 2020-07-22 MED ORDER — CHLORHEXIDINE GLUCONATE 0.12 % MT SOLN: 15.0000 mL | Freq: Once | OROMUCOSAL | Status: AC

## 2020-07-22 MED ORDER — LACTATED RINGERS IV SOLN: INTRAVENOUS | Status: DC

## 2020-07-22 MED ORDER — MEPERIDINE HCL 50 MG/ML IJ SOLN: 6.2500 mg | INTRAMUSCULAR | Status: DC | PRN

## 2020-07-22 MED ORDER — BELLADONNA ALKALOIDS-OPIUM 16.2-60 MG RE SUPP
RECTAL | Status: AC
  Filled 2020-07-22: qty 1

## 2020-07-22 MED ORDER — HYDROMORPHONE HCL 1 MG/ML IJ SOLN
INTRAMUSCULAR | Status: AC
  Administered 2020-07-22: 0.5 mg via INTRAVENOUS
  Filled 2020-07-22: qty 1

## 2020-07-22 MED ORDER — FENTANYL CITRATE (PF) 100 MCG/2ML IJ SOLN
25.0000 ug | INTRAMUSCULAR | Status: AC | PRN
  Administered 2020-07-22 (×4): 25 ug via INTRAVENOUS

## 2020-07-22 MED ORDER — BUPIVACAINE HCL (PF) 0.5 % IJ SOLN
INTRAMUSCULAR | Status: AC
  Filled 2020-07-22: qty 30

## 2020-07-22 MED ORDER — ONDANSETRON HCL 4 MG/2ML IJ SOLN: 4.0000 mg | Freq: Four times a day (QID) | INTRAMUSCULAR | Status: DC | PRN

## 2020-07-22 MED ORDER — SUGAMMADEX SODIUM 200 MG/2ML IV SOLN
INTRAVENOUS | Status: DC | PRN
  Administered 2020-07-22: 200 mg via INTRAVENOUS

## 2020-07-22 MED ORDER — DOCUSATE SODIUM 100 MG PO CAPS
100.0000 mg | ORAL_CAPSULE | Freq: Two times a day (BID) | ORAL | Status: DC
  Administered 2020-07-22 – 2020-07-24 (×4): 100 mg via ORAL
  Filled 2020-07-22 (×4): qty 1

## 2020-07-22 MED ORDER — KETOROLAC TROMETHAMINE 30 MG/ML IJ SOLN: 30.0000 mg | Freq: Once | INTRAMUSCULAR | Status: DC | PRN

## 2020-07-22 MED ORDER — ONDANSETRON HCL 4 MG/2ML IJ SOLN
INTRAMUSCULAR | Status: DC | PRN
  Administered 2020-07-22: 4 mg via INTRAVENOUS

## 2020-07-22 MED ORDER — HYDROMORPHONE HCL 1 MG/ML IJ SOLN
0.5000 mg | INTRAMUSCULAR | Status: DC | PRN
  Administered 2020-07-22: 0.5 mg via INTRAVENOUS

## 2020-07-22 MED ORDER — ACETAMINOPHEN 10 MG/ML IV SOLN
INTRAVENOUS | Status: DC | PRN
  Administered 2020-07-22: 1000 mg via INTRAVENOUS

## 2020-07-22 MED ORDER — DEXAMETHASONE SODIUM PHOSPHATE 10 MG/ML IJ SOLN
INTRAMUSCULAR | Status: AC
  Filled 2020-07-22: qty 1

## 2020-07-22 MED ORDER — POVIDONE-IODINE 10 % EX SWAB: 2.0000 "application " | Freq: Once | CUTANEOUS | Status: DC

## 2020-07-22 MED ORDER — KETOROLAC TROMETHAMINE 30 MG/ML IJ SOLN
INTRAMUSCULAR | Status: DC | PRN
  Administered 2020-07-22: 30 mg via INTRAVENOUS

## 2020-07-22 MED ORDER — HYDROCODONE-ACETAMINOPHEN 7.5-325 MG PO TABS
1.0000 | ORAL_TABLET | Freq: Once | ORAL | Status: DC | PRN
  Filled 2020-07-22: qty 1

## 2020-07-22 MED ORDER — FAMOTIDINE 20 MG PO TABS
ORAL_TABLET | ORAL | Status: AC
  Administered 2020-07-22: 20 mg via ORAL
  Filled 2020-07-22: qty 1

## 2020-07-22 MED ORDER — ACETAMINOPHEN 160 MG/5ML PO SOLN
325.0000 mg | ORAL | Status: DC | PRN
  Filled 2020-07-22: qty 20.3

## 2020-07-22 MED ORDER — PROPOFOL 10 MG/ML IV BOLUS
INTRAVENOUS | Status: DC | PRN
  Administered 2020-07-22: 160 mg via INTRAVENOUS

## 2020-07-22 MED ORDER — KETAMINE HCL 50 MG/ML IJ SOLN
INTRAMUSCULAR | Status: DC | PRN
  Administered 2020-07-22: 50 mg via INTRAMUSCULAR

## 2020-07-22 MED ORDER — FENTANYL CITRATE (PF) 250 MCG/5ML IJ SOLN
INTRAMUSCULAR | Status: DC | PRN
  Administered 2020-07-22: 50 ug via INTRAVENOUS

## 2020-07-22 MED ORDER — KETOROLAC TROMETHAMINE 30 MG/ML IJ SOLN
30.0000 mg | Freq: Four times a day (QID) | INTRAMUSCULAR | Status: AC
  Administered 2020-07-22 – 2020-07-23 (×4): 30 mg via INTRAVENOUS
  Filled 2020-07-22 (×4): qty 1

## 2020-07-22 MED ORDER — MIDAZOLAM HCL 2 MG/2ML IJ SOLN
INTRAMUSCULAR | Status: AC
  Filled 2020-07-22: qty 2

## 2020-07-22 MED ORDER — SODIUM CHLORIDE 0.9% FLUSH: 9.0000 mL | INTRAVENOUS | Status: DC | PRN

## 2020-07-22 MED ORDER — NALOXONE HCL 0.4 MG/ML IJ SOLN: 0.4000 mg | INTRAMUSCULAR | Status: DC | PRN

## 2020-07-22 MED ORDER — CHLORHEXIDINE GLUCONATE 0.12 % MT SOLN
OROMUCOSAL | Status: AC
  Administered 2020-07-22: 15 mL via OROMUCOSAL
  Filled 2020-07-22: qty 15

## 2020-07-22 MED ORDER — PHENYLEPHRINE HCL (PRESSORS) 10 MG/ML IV SOLN
INTRAVENOUS | Status: DC | PRN
  Administered 2020-07-22 (×6): 100 ug via INTRAVENOUS

## 2020-07-22 MED ORDER — KETOROLAC TROMETHAMINE 30 MG/ML IJ SOLN
INTRAMUSCULAR | Status: AC
  Filled 2020-07-22: qty 1

## 2020-07-22 MED ORDER — ACETAMINOPHEN 325 MG PO TABS: 325.0000 mg | ORAL_TABLET | ORAL | Status: DC | PRN

## 2020-07-22 MED ORDER — ACETAMINOPHEN 500 MG PO TABS
1000.0000 mg | ORAL_TABLET | Freq: Four times a day (QID) | ORAL | Status: DC
  Administered 2020-07-22 – 2020-07-24 (×7): 1000 mg via ORAL
  Filled 2020-07-22 (×8): qty 2

## 2020-07-22 MED ORDER — BISACODYL 10 MG RE SUPP: 10.0000 mg | Freq: Every day | RECTAL | Status: DC | PRN

## 2020-07-22 MED ORDER — SIMETHICONE 80 MG PO CHEW
80.0000 mg | CHEWABLE_TABLET | Freq: Four times a day (QID) | ORAL | Status: DC | PRN
  Administered 2020-07-22 – 2020-07-24 (×7): 80 mg via ORAL
  Filled 2020-07-22 (×7): qty 1

## 2020-07-22 MED ORDER — PROMETHAZINE HCL 25 MG/ML IJ SOLN: 6.2500 mg | INTRAMUSCULAR | Status: DC | PRN

## 2020-07-22 MED ORDER — ROCURONIUM BROMIDE 100 MG/10ML IV SOLN
INTRAVENOUS | Status: DC | PRN
  Administered 2020-07-22: 20 mg via INTRAVENOUS
  Administered 2020-07-22: 50 mg via INTRAVENOUS

## 2020-07-22 MED ORDER — DROPERIDOL 2.5 MG/ML IJ SOLN
0.6250 mg | Freq: Once | INTRAMUSCULAR | Status: DC | PRN
  Filled 2020-07-22: qty 2

## 2020-07-22 MED ORDER — MIDAZOLAM HCL 2 MG/2ML IJ SOLN
INTRAMUSCULAR | Status: DC | PRN
  Administered 2020-07-22: 2 mg via INTRAVENOUS

## 2020-07-22 MED ORDER — DIPHENHYDRAMINE HCL 50 MG/ML IJ SOLN: 12.5000 mg | Freq: Four times a day (QID) | INTRAMUSCULAR | Status: DC | PRN

## 2020-07-22 MED ORDER — LIDOCAINE HCL (CARDIAC) PF 100 MG/5ML IV SOSY
PREFILLED_SYRINGE | INTRAVENOUS | Status: DC | PRN
  Administered 2020-07-22: 80 mg via INTRAVENOUS

## 2020-07-22 MED ORDER — BUPIVACAINE HCL (PF) 0.5 % IJ SOLN
INTRAMUSCULAR | Status: DC | PRN
  Administered 2020-07-22: 10 mL

## 2020-07-22 MED ORDER — DEXAMETHASONE SODIUM PHOSPHATE 10 MG/ML IJ SOLN
INTRAMUSCULAR | Status: DC | PRN
  Administered 2020-07-22: 10 mg via INTRAVENOUS

## 2020-07-22 MED ORDER — ONDANSETRON HCL 4 MG/2ML IJ SOLN
INTRAMUSCULAR | Status: AC
  Filled 2020-07-22: qty 2

## 2020-07-22 MED ORDER — MORPHINE SULFATE 2 MG/ML IV SOLN
INTRAVENOUS | Status: DC
  Administered 2020-07-23 – 2020-07-24 (×2): 12 mg via INTRAVENOUS
  Administered 2020-07-24: 3 mg via INTRAVENOUS
  Filled 2020-07-22: qty 25
  Filled 2020-07-22: qty 50
  Filled 2020-07-22: qty 25

## 2020-07-22 MED ORDER — DIPHENHYDRAMINE HCL 12.5 MG/5ML PO ELIX
12.5000 mg | ORAL_SOLUTION | Freq: Four times a day (QID) | ORAL | Status: DC | PRN
  Filled 2020-07-22: qty 5

## 2020-07-22 MED ORDER — ACETAMINOPHEN 10 MG/ML IV SOLN
INTRAVENOUS | Status: AC
  Filled 2020-07-22: qty 100

## 2020-07-22 MED ORDER — FENTANYL CITRATE (PF) 100 MCG/2ML IJ SOLN
INTRAMUSCULAR | Status: AC
  Filled 2020-07-22: qty 2

## 2020-07-22 SURGICAL SUPPLY — 72 items
BAG COUNTER SPONGE EZ (MISCELLANEOUS) ×4 IMPLANT
BAG ISOLATATION DRAPE 20X20 ST (DRAPES) IMPLANT
BLADE 12 ID 8INL TREPHINE (BLADE) ×1 IMPLANT
BLADE SURG SZ11 CARB STEEL (BLADE) ×4 IMPLANT
CANISTER SUCT 1200ML W/VALVE (MISCELLANEOUS) ×4 IMPLANT
CATH ROBINSON RED A/P 16FR (CATHETERS) ×4 IMPLANT
CHLORAPREP W/TINT 26 (MISCELLANEOUS) ×4 IMPLANT
COVER WAND RF STERILE (DRAPES) ×4 IMPLANT
DERMABOND ADVANCED (GAUZE/BANDAGES/DRESSINGS) ×1
DERMABOND ADVANCED .7 DNX12 (GAUZE/BANDAGES/DRESSINGS) ×3 IMPLANT
DRAPE ISOLATE BAG 20X20 STRL (DRAPES) ×1
DRAPE LAPAROTOMY 100X77 ABD (DRAPES) ×4 IMPLANT
DRAPE LAPAROTOMY TRNSV 106X77 (MISCELLANEOUS) ×4 IMPLANT
DRSG TELFA 3X8 NADH (GAUZE/BANDAGES/DRESSINGS) ×4 IMPLANT
DRSG TELFA 4X3 1S NADH ST (GAUZE/BANDAGES/DRESSINGS) IMPLANT
ELECT BLADE 6 FLAT ULTRCLN (ELECTRODE) ×4 IMPLANT
ELECT CAUTERY BLADE 6.4 (BLADE) ×4 IMPLANT
ELECT REM PT RETURN 9FT ADLT (ELECTROSURGICAL) ×4
ELECTRODE REM PT RTRN 9FT ADLT (ELECTROSURGICAL) ×3 IMPLANT
EXTRT SYSTEM ALEXIS 17CM (MISCELLANEOUS) ×4
GAUZE 4X4 16PLY RFD (DISPOSABLE) ×4 IMPLANT
GAUZE SPONGE 4X4 12PLY STRL (GAUZE/BANDAGES/DRESSINGS) ×4 IMPLANT
GLOVE BIO SURGEON STRL SZ8 (GLOVE) ×8 IMPLANT
GLOVE INDICATOR 8.0 STRL GRN (GLOVE) ×4 IMPLANT
GOWN STRL REUS W/ TWL LRG LVL3 (GOWN DISPOSABLE) ×3 IMPLANT
GOWN STRL REUS W/ TWL XL LVL3 (GOWN DISPOSABLE) ×3 IMPLANT
GOWN STRL REUS W/TWL LRG LVL3 (GOWN DISPOSABLE) ×1
GOWN STRL REUS W/TWL XL LVL3 (GOWN DISPOSABLE) ×1
GRASPER SUT TROCAR 14GX15 (MISCELLANEOUS) ×4 IMPLANT
HOLDER FOLEY CATH W/STRAP (MISCELLANEOUS) ×4 IMPLANT
IRRIGATION STRYKERFLOW (MISCELLANEOUS) IMPLANT
IRRIGATOR STRYKERFLOW (MISCELLANEOUS)
IV LACTATED RINGERS 1000ML (IV SOLUTION) IMPLANT
KIT PINK PAD W/HEAD ARE REST (MISCELLANEOUS) ×4
KIT PINK PAD W/HEAD ARM REST (MISCELLANEOUS) ×3 IMPLANT
KIT TURNOVER KIT A (KITS) ×4 IMPLANT
LABEL OR SOLS (LABEL) ×4 IMPLANT
NEEDLE VERESS 14GA 120MM (NEEDLE) ×4 IMPLANT
NS IRRIG 1000ML POUR BTL (IV SOLUTION) ×4 IMPLANT
NS IRRIG 500ML POUR BTL (IV SOLUTION) ×4 IMPLANT
PACK BASIN MAJOR ARMC (MISCELLANEOUS) ×4 IMPLANT
PACK GYN LAPAROSCOPIC (MISCELLANEOUS) ×4 IMPLANT
PAD DRESSING TELFA 3X8 NADH (GAUZE/BANDAGES/DRESSINGS) ×3 IMPLANT
PAD OB MATERNITY 4.3X12.25 (PERSONAL CARE ITEMS) ×4 IMPLANT
PAD PREP 24X41 OB/GYN DISP (PERSONAL CARE ITEMS) ×4 IMPLANT
POUCH SPECIMEN RETRIEVAL 10MM (ENDOMECHANICALS) IMPLANT
SCISSORS METZENBAUM CVD 33 (INSTRUMENTS) ×4 IMPLANT
SET TUBE SMOKE EVAC HIGH FLOW (TUBING) ×4 IMPLANT
SHEARS HARMONIC ACE PLUS 36CM (ENDOMECHANICALS) IMPLANT
SLEEVE ENDOPATH XCEL 5M (ENDOMECHANICALS) IMPLANT
SOL PREP PROV IODINE SCRUB 4OZ (MISCELLANEOUS) ×4 IMPLANT
SPONGE GAUZE 2X2 8PLY STRL LF (GAUZE/BANDAGES/DRESSINGS) IMPLANT
SPONGE LAP 18X18 RF (DISPOSABLE) ×4 IMPLANT
STAPLER SKIN PROX 35W (STAPLE) ×4 IMPLANT
STRAP SAFETY 5IN WIDE (MISCELLANEOUS) ×4 IMPLANT
STRIP CLOSURE SKIN 1/2X4 (GAUZE/BANDAGES/DRESSINGS) ×4 IMPLANT
SUT ETHIBOND CT1 BRD #0 30IN (SUTURE) ×4 IMPLANT
SUT VIC AB 0 CT1 27 (SUTURE) ×1
SUT VIC AB 0 CT1 27XCR 8 STRN (SUTURE) ×3 IMPLANT
SUT VIC AB 0 CT1 36 (SUTURE) ×4 IMPLANT
SUT VIC AB 1 CT1 36 (SUTURE) ×4 IMPLANT
SUT VIC AB 2-0 UR6 27 (SUTURE) IMPLANT
SUT VIC AB 4-0 PS2 18 (SUTURE) ×4 IMPLANT
SYR 10ML LL (SYRINGE) ×4 IMPLANT
SYR 30ML LL (SYRINGE) ×4 IMPLANT
SYSTEM CONTND EXTRCTN KII BLLN (MISCELLANEOUS) IMPLANT
SYSTEM WECK SHIELD CLOSURE (TROCAR) IMPLANT
TRAY FOLEY MTR SLVR 16FR STAT (SET/KITS/TRAYS/PACK) ×4 IMPLANT
TRAY PREP VAG/GEN (MISCELLANEOUS) ×4 IMPLANT
TROCAR ENDO BLADELESS 11MM (ENDOMECHANICALS) IMPLANT
TROCAR XCEL NON-BLD 5MMX100MML (ENDOMECHANICALS) ×4 IMPLANT
YANKAUER SUCT BULB TIP FLEX NO (MISCELLANEOUS) ×2 IMPLANT

## 2020-07-22 NOTE — Discharge Instructions (Signed)
Unilateral Salpingo-Oophorectomy, Care After This sheet gives you information about how to care for yourself after your procedure. Your health care provider may also give you more specific instructions. If you have problems or questions, contact your health care provider. What can I expect after the procedure? After the procedure, it is common to have:  Abdominal pain.  Some occasional vaginal bleeding (spotting).  Tiredness. Follow these instructions at home: Incision care   Keep your incision area and your bandage (dressing) clean and dry.  Follow instructions from your health care provider about how to take care of your incision. Make sure you: ? Wash your hands with soap and water before you change your dressing. If soap and water are not available, use hand sanitizer. ? Change your dressing as told by your health care provider. ? Leave stitches (sutures), staples, skin glue, or adhesive strips in place. These skin closures may need to stay in place for 2 weeks or longer. If adhesive strip edges start to loosen and curl up, you may trim the loose edges. Do not remove adhesive strips completely unless your health care provider tells you to do that.  Check your incision area every day for signs of infection. Check for: ? Redness, swelling, or pain. ? Fluid or blood. ? Warmth. ? Pus or a bad smell. Activity  Do not drive or use heavy machinery while taking prescription pain medicine.  Do not drive for 24 hours if you received a medicine to help you relax (sedative).  Take frequent, short walks throughout the day. Rest when you get tired. Ask your health care provider what activities are safe for you.  Avoid activities that require great effort. Also, avoid heavy lifting. Do not lift anything that is heavier than 5 lb (2.3 kg), or the limit that your health care provider tells you, until he or she says that it is safe to do so.  Do not douche, use tampons, or have sex until your  health care provider approves. General instructions  To prevent or treat constipation while you are taking prescription pain medicine, your health care provider may recommend that you: ? Drink enough fluid to keep your urine pale yellow. ? Take over-the-counter or prescription medicines. ? Eat foods that are high in fiber, such as fresh fruits and vegetables, whole grains, and beans. ? Limit foods that are high in fat and processed sugars, such as fried and sweet foods.  Take over-the-counter and prescription medicines only as told by your health care provider.  Do not take baths, swim, or use a hot tub until your health care provider approves. Ask your health care provider if you may take showers. You may only be allowed to take sponge baths.  Wear compression stockings as told by your health care provider. These stockings help to prevent blood clots and reduce swelling in your legs.  Keep all follow-up visits as told by your health care provider. This is important. Contact a health care provider if:  You have pain when you urinate.  You have pus or a bad smelling discharge coming from your vagina.  You have redness, swelling, or pain around your incision.  You have fluid or blood coming from your incision.  Your incision feels warm to the touch.  You have pus or a bad smell coming from your incision.  You have a fever.  Your incision starts to break open.  You have abdominal pain that gets worse or does not get better with medicine.    You develop a rash.  You develop nausea and vomiting.  You feel lightheaded. Get help right away if:  You develop pain in your chest or leg.  You develop shortness of breath.  You faint.  You have increased bleeding from your vagina. Summary  After the procedure, it is common to have pain, tiredness, and occasional bleeding from the vagina.  Follow instructions from your health care provider about how to take care of your  incision.  Check your incision every day for signs of infection and report any symptoms to your health care provider.  Follow instructions from your health care provider about activities and restrictions. This information is not intended to replace advice given to you by your health care provider. Make sure you discuss any questions you have with your health care provider. Document Revised: 11/16/2017 Document Reviewed: 03/15/2017 Elsevier Patient Education  2020 [employer]  

## 2020-07-22 NOTE — Anesthesia Procedure Notes (Signed)
Procedure Name: Intubation Date/Time: 07/22/2020 9:34 AM Performed by: Eben Burow, CRNA Pre-anesthesia Checklist: Patient identified, Emergency Drugs available, Suction available and Patient being monitored Patient Re-evaluated:Patient Re-evaluated prior to induction Oxygen Delivery Method: Circle system utilized Preoxygenation: Pre-oxygenation with 100% oxygen Induction Type: IV induction Ventilation: Mask ventilation without difficulty Laryngoscope Size: Miller and 2 Grade View: Grade I Tube type: Oral Tube size: 7.0 mm Number of attempts: 1 Airway Equipment and Method: Stylet Placement Confirmation: ETT inserted through vocal cords under direct vision,  positive ETCO2 and breath sounds checked- equal and bilateral Secured at: 21 cm Tube secured with: Tape Dental Injury: Teeth and Oropharynx as per pre-operative assessment

## 2020-07-22 NOTE — Anesthesia Preprocedure Evaluation (Addendum)
Anesthesia Evaluation  Patient identified by MRN, date of birth, ID band Patient awake    Reviewed: Allergy & Precautions, H&P , NPO status , Patient's Chart, lab work & pertinent test results, reviewed documented beta blocker date and time   Airway Mallampati: II  TM Distance: >3 FB Neck ROM: full    Dental  (+) Teeth Intact, Chipped   Pulmonary asthma , Current Smoker and Patient abstained from smoking.,  Inhaler once a year States doesn't really smoke, only occasionally   Pulmonary exam normal        Cardiovascular negative cardio ROS Normal cardiovascular exam     Neuro/Psych PSYCHIATRIC DISORDERS Depression negative neurological ROS     GI/Hepatic negative GI ROS, Neg liver ROS, neg GERD  ,  Endo/Other  negative endocrine ROS  Renal/GU negative Renal ROS     Musculoskeletal negative musculoskeletal ROS (+)   Abdominal   Peds  Hematology negative hematology ROS (+)   Anesthesia Other Findings Past Medical History: Pos THC on UDS Neg UPT  No date: Asthma No date: Borderline personality disorder (HCC) No date: Depression History reviewed. No pertinent surgical history.   Reproductive/Obstetrics                            Anesthesia Physical Anesthesia Plan  ASA: II  Anesthesia Plan: General   Post-op Pain Management:    Induction: Intravenous  PONV Risk Score and Plan: Ondansetron and Treatment may vary due to age or medical condition  Airway Management Planned: Oral ETT  Additional Equipment:   Intra-op Plan:   Post-operative Plan: Extubation in OR  Informed Consent: I have reviewed the patients History and Physical, chart, labs and discussed the procedure including the risks, benefits and alternatives for the proposed anesthesia with the patient or authorized representative who has indicated his/her understanding and acceptance.     Dental Advisory Given  Plan  Discussed with: CRNA  Anesthesia Plan Comments:         Anesthesia Quick Evaluation

## 2020-07-22 NOTE — Op Note (Signed)
  Operative Note  07/22/2020  PRE-OP DIAGNOSIS:  Dermoid cyst right ovary Right lower quadrant pain   POST-OP DIAGNOSIS: Large Left Ovarian Cyst   PROCEDURE: Procedure(s): LAPAROTOMY LEFT OOPHORECTOMY   SURGEON: Barnett Applebaum, MD, FACOG  ASSISTANT: Dr Glennon Mac, No other capable assistant available, in surgery requiring high level assistant.  ANESTHESIA: Choice   ESTIMATED BLOOD LOSS: Minimal, 20 mL  SPECIMENS:  Left Ovary  COMPLICATIONS:  none  DISPOSITION: PACU - hemodynamically stable.  CONDITION: stable  FINDINGS: Exam under anesthesia revealed pelvic mass midline to right sided. Intrabdominal survey revealed Large cystic mass originating from the LEFT ovary.  Right ovary normal.  Tubes normal.  Cystic mass appears serous yet is large and complex/ multicystic.  May be some solid components.  Difficult to ascertain dermoid until removed and dissected.  PROCEDURE IN DETAIL: After informed consent was obtained, the patient was taken to the operating room where general endotracheal anesthesia was performed without difficulty. The patient was positioned in the supine position and the usual precautions taken with her arms in arm boards bilaterally.She was prepped and draped in normal sterile fashion. Time-out was performed and a Foley catheter was placed into the bladder.  A transverse incision incision was made and carried down to the underlying fascia. The fascia was dissected bilaterally using mayo scissors, and then the rectus fascia was dissected away from the muscle and separated in the midline. The peritoneum was then entered. Bladder is inferiorally retracted and the bowel was freed up and packed gently  and an Alexis abdominal retractor was then placed.  Left Ovary is identified and examined as above, and cannot differentiate between normal ovarian tissue and cystic tissue to safely perform cystectomy.   The left ovary is clamped at its pedicle with preservation of blood flow to  tube.   Suture ligation is performed for amputation of the ovarian mass.   The mass is then placed in a Sterile Isolation Bag prior to rupture and aspiration of the cystic fluid, to protect spill into abdomen.  It is then collapsable and removed.  Inspection and dissection on the specimen once out of her body is achieved.  After the abdomen was irrigated and all pedicles felt to be hemostatic the laparotomy packs were removed, the peritoneum was closed with a running suture of 2-0 vicryl suture, and the fascia was then reapproximated in a running suture with 0-Maxon.   The subcutaneous space was then irrigated, and hemostasis assured using electrocautery.  The subcutaneous is closed with a 2-0 Plain Gut suture and  the skin was closed with 4-0 vicryl suture.  Sponge, lap, needle and instrument counts were correct x2 at the end of the procedure. Patient goes to recovery room in stable condition.  Foley in place.  The assistance of my assisting-physician was vital to resect and retract interchangably with self on each side.  Barnett Applebaum, MD, [facility], [facility] 07/22/2020  11:06 AM

## 2020-07-22 NOTE — Telephone Encounter (Signed)
-----   Message from Gae Dry, MD sent at 07/22/2020 11:17 AM EDT ----- Regarding: Appt Sch post op next week plz (Wed)

## 2020-07-22 NOTE — Interval H&P Note (Signed)
History and Physical Interval Note:  07/22/2020 9:04 AM  Veronica Clay  has presented today for surgery, with the diagnosis of Dermoid cyst right ovary and Right lower quadrant pain.  The various methods of treatment have been discussed with the patient and family. After consideration of risks, benefits and other options for treatment, the patient has consented to  Procedure(s): LAPAROTOMY with OVARIAN CYSTECTOMY - POSSIBLE OOPHORECTOMY (Right) as a surgical intervention.  The patient's history has been reviewed, patient examined, no change in status, stable for surgery.  I have reviewed the patient's chart and labs.  Questions were answered to the patient's satisfaction.     Hoyt Koch

## 2020-07-22 NOTE — Transfer of Care (Signed)
Immediate Anesthesia Transfer of Care Note  Patient: Veronica Clay  Procedure(s) Performed: LAPAROTOMY (N/A ) OVARIAN CYSTECTOMY (Right ) OOPHORECTOMY (Left )  Patient Location: PACU  Anesthesia Type:General  Level of Consciousness: drowsy  Airway & Oxygen Therapy: Patient Spontanous Breathing and Patient connected to face mask oxygen  Post-op Assessment: Report given to RN and Post -op Vital signs reviewed and stable  Post vital signs: Reviewed and stable  Last Vitals:  Vitals Value Taken Time  BP 110/86 07/22/20 1108  Temp 98.40F   Pulse 78 07/22/20 1110  Resp 31 07/22/20 1110  SpO2 100 % 07/22/20 1110  Vitals shown include unvalidated device data.  Last Pain:  Vitals:   07/22/20 0848  TempSrc: Temporal  PainSc: 3          Complications: No complications documented.

## 2020-07-22 NOTE — Telephone Encounter (Signed)
Left generic message for patient to call back to confirm schedule appointment. Per [facility] scheduled Wednesday, 07/28/20 10:30

## 2020-07-23 DIAGNOSIS — J45909 Unspecified asthma, uncomplicated: Secondary | ICD-10-CM | POA: Diagnosis not present

## 2020-07-23 DIAGNOSIS — Z79899 Other long term (current) drug therapy: Secondary | ICD-10-CM | POA: Diagnosis not present

## 2020-07-23 DIAGNOSIS — R1909 Other intra-abdominal and pelvic swelling, mass and lump: Secondary | ICD-10-CM | POA: Diagnosis not present

## 2020-07-23 DIAGNOSIS — D271 Benign neoplasm of left ovary: Secondary | ICD-10-CM | POA: Diagnosis not present

## 2020-07-23 DIAGNOSIS — F17200 Nicotine dependence, unspecified, uncomplicated: Secondary | ICD-10-CM | POA: Diagnosis not present

## 2020-07-23 DIAGNOSIS — D279 Benign neoplasm of unspecified ovary: Secondary | ICD-10-CM | POA: Diagnosis not present

## 2020-07-23 DIAGNOSIS — N83201 Unspecified ovarian cyst, right side: Secondary | ICD-10-CM | POA: Diagnosis not present

## 2020-07-23 DIAGNOSIS — R1031 Right lower quadrant pain: Secondary | ICD-10-CM | POA: Diagnosis not present

## 2020-07-23 DIAGNOSIS — N83202 Unspecified ovarian cyst, left side: Secondary | ICD-10-CM | POA: Diagnosis not present

## 2020-07-23 LAB — HEMOGLOBIN: Hemoglobin: 10.7 g/dL — ABNORMAL LOW (ref 12.0–15.0)

## 2020-07-23 NOTE — Progress Notes (Signed)
1 Day Post-Op Procedure(s) (LRB): LAPAROTOMY OOPHORECTOMY (Left)  Subjective: Patient reports incisional pain, tolerating PO and no problems voiding.    Objective: I have reviewed patient's vital signs, intake and output, medications and labs.  Abd: Min T, ND Incision: clean, dry and intact Extr: no calf T, no edema  Assessment: s/p Procedure(s): LAPAROTOMY  OOPHORECTOMY (Left): stable  Plan: Advance diet Encourage ambulation Advance to PO medication   Due to pain and early post op, will cont inpatient today and monitor pain mgt as well as diet and ambulation Anticipate d/c 07/24/20 Has scheduled post op for 07/28/20 at 33 Percocet for pain, now and on d/c as well    Wean off morphine PCA Mild anemia, Fe Discussed surgery and pathology w pt.  Expectations for recovery and future ovarian cysts.   LOS: 0 days    Hoyt Koch 07/23/2020, 7:22 AM

## 2020-07-24 DIAGNOSIS — J45909 Unspecified asthma, uncomplicated: Secondary | ICD-10-CM | POA: Diagnosis not present

## 2020-07-24 DIAGNOSIS — R1031 Right lower quadrant pain: Secondary | ICD-10-CM | POA: Diagnosis not present

## 2020-07-24 DIAGNOSIS — Z79899 Other long term (current) drug therapy: Secondary | ICD-10-CM | POA: Diagnosis not present

## 2020-07-24 DIAGNOSIS — D271 Benign neoplasm of left ovary: Secondary | ICD-10-CM | POA: Diagnosis not present

## 2020-07-24 DIAGNOSIS — N83201 Unspecified ovarian cyst, right side: Secondary | ICD-10-CM | POA: Diagnosis not present

## 2020-07-24 DIAGNOSIS — R1909 Other intra-abdominal and pelvic swelling, mass and lump: Secondary | ICD-10-CM | POA: Diagnosis not present

## 2020-07-24 DIAGNOSIS — F17200 Nicotine dependence, unspecified, uncomplicated: Secondary | ICD-10-CM | POA: Diagnosis not present

## 2020-07-24 DIAGNOSIS — N83202 Unspecified ovarian cyst, left side: Secondary | ICD-10-CM | POA: Diagnosis not present

## 2020-07-24 DIAGNOSIS — D279 Benign neoplasm of unspecified ovary: Secondary | ICD-10-CM | POA: Diagnosis not present

## 2020-07-24 MED ORDER — IBUPROFEN 600 MG PO TABS: 600.0000 mg | ORAL_TABLET | Freq: Four times a day (QID) | ORAL | 0 refills | Status: DC | PRN

## 2020-07-24 MED ORDER — OXYCODONE-ACETAMINOPHEN 5-325 MG PO TABS: 1.0000 | ORAL_TABLET | ORAL | 0 refills | Status: DC | PRN

## 2020-07-24 MED ORDER — IBUPROFEN 600 MG PO TABS
600.0000 mg | ORAL_TABLET | Freq: Four times a day (QID) | ORAL | Status: DC | PRN
  Administered 2020-07-24: 600 mg via ORAL
  Filled 2020-07-24: qty 1

## 2020-07-24 NOTE — Discharge Summary (Signed)
Physician Discharge Summary  Patient ID: Veronica Clay MRN: [id number] DOB/AGE: 19-Oct-1996 24 y.o.  Admit date: 07/22/2020 Discharge date: 07/24/2020  Admission Diagnoses: Right ovarian dermoid  Discharge Diagnoses:  Active Problems:   Right ovarian cyst   Pelvic mass   Ovarian cyst, left   Discharged Condition: good  Hospital Course: 23 you admitted for scheduled removal of right ovarian dermoid. Given size laparotomy approach was taken.  The patient had an uneventful surgery, benign postoperative course.  She was discharged on POD2 meeting all discharge criteria including remaining hemodynamically stable and afebrile throughout admission, tolerating po, voiding, ambulating, and reporting good pain control  Consults: None  Significant Diagnostic Studies:  Results for orders placed or performed during the hospital encounter of 07/22/20 (from the past 72 hour(s))  Urine Drug Screen, Qualitative ([place] only)     Status: Abnormal   Collection Time: 07/22/20  8:37 AM  Result Value Ref Range   Tricyclic, Ur Screen NONE DETECTED NONE DETECTED   Amphetamines, Ur Screen NONE DETECTED NONE DETECTED   MDMA (Ecstasy)Ur Screen NONE DETECTED NONE DETECTED   Cocaine Metabolite,Ur [place] NONE DETECTED NONE DETECTED   Opiate, Ur Screen NONE DETECTED NONE DETECTED   Phencyclidine (PCP) Ur S NONE DETECTED NONE DETECTED   Cannabinoid 50 Ng, Ur [place] POSITIVE (A) NONE DETECTED   Barbiturates, Ur Screen NONE DETECTED NONE DETECTED   Benzodiazepine, Ur Scrn NONE DETECTED NONE DETECTED   Methadone Scn, Ur NONE DETECTED NONE DETECTED    Comment: (NOTE) Tricyclics + metabolites, urine    Cutoff 1000 ng/mL Amphetamines + metabolites, urine  Cutoff 1000 ng/mL MDMA (Ecstasy), urine              Cutoff 500 ng/mL Cocaine Metabolite, urine          Cutoff 300 ng/mL Opiate + metabolites, urine        Cutoff 300 ng/mL Phencyclidine (PCP), urine         Cutoff 25 ng/mL Cannabinoid, urine                 Cutoff 50  ng/mL Barbiturates + metabolites, urine  Cutoff 200 ng/mL Benzodiazepine, urine              Cutoff 200 ng/mL Methadone, urine                   Cutoff 300 ng/mL  The urine drug screen provides only a preliminary, unconfirmed analytical test result and should not be used for non-medical purposes. Clinical consideration and professional judgment should be applied to any positive drug screen result due to possible interfering substances. A more specific alternate chemical method must be used in order to obtain a confirmed analytical result. Gas chromatography / mass spectrometry (GC/MS) is the preferred confirm atory method. Performed at [facility], [address] [place], [place] [address]   Pregnancy, urine POC     Status: None   Collection Time: 07/22/20  8:39 AM  Result Value Ref Range   Preg Test, Ur NEGATIVE NEGATIVE    Comment:        THE SENSITIVITY OF THIS METHODOLOGY IS >24 mIU/mL   ABO/Rh     Status: None   Collection Time: 07/22/20  8:51 AM  Result Value Ref Range   ABO/RH(D)      O POS Performed at [facility], [address] [place], [place] [address]   Hemoglobin     Status: Abnormal   Collection Time: 07/23/20  6:11  AM  Result Value Ref Range   Hemoglobin 10.7 (L) 12.0 - 15.0 g/dL    Comment: Performed at [facility], [address] [place], [place] [address]     Treatments: surgery: laposcopic oophorectomy  Discharge Exam: Blood pressure 105/74, pulse 73, temperature 98.2 F (36.8 C), temperature source Oral, resp. rate 18, height 5\' 7"  (1.702 m), weight 87.1 kg, last menstrual period 07/21/2020, SpO2 99 %. General appearance: alert, appears stated age and no distress GI: soft, appropriately tender, non-distended Extremities: extremities normal, atraumatic, no cyanosis or edema Incision/Wound: D/C/I  Disposition: Discharge disposition: 01-Home or Self Care       Discharge Instructions    Call MD for:    Complete by: As directed    Heavy vaginal bleeding greater than 1 pad an hour   Call MD for:  difficulty breathing, headache or visual disturbances   Complete by: As directed    Call MD for:  extreme fatigue   Complete by: As directed    Call MD for:  hives   Complete by: As directed    Call MD for:  persistant dizziness or light-headedness   Complete by: As directed    Call MD for:  persistant nausea and vomiting   Complete by: As directed    Call MD for:  redness, tenderness, or signs of infection (pain, swelling, redness, odor or green/yellow discharge around incision site)   Complete by: As directed    Call MD for:  severe uncontrolled pain   Complete by: As directed    Call MD for:  temperature >100.4   Complete by: As directed    Diet general   Complete by: As directed    Discharge wound care:   Complete by: As directed    You may apply a light dressing for minor discharge from the incision or to keep waistbands of clothing from rubbing.  You may also have been discharge with a clear dressing in which case this will be removed at your postoperative clinic visit.  You may shower, use soap on your incision.  Avoid baths or soaking the incision in the first 6 weeks following your surgery..   Driving restriction   Complete by: As directed    Avoid driving for at least 2 weeks or while taking prescription narcotics.   Lifting restrictions   Complete by: As directed    Weight restriction of 10lbs for 6 weeks.     Allergies as of 07/24/2020      Reactions   Shellfish Allergy Anaphylaxis   Tongue swelling      Medication List    STOP taking these medications   Pamprin All Day Relief Max St 220 MG tablet Generic drug: naproxen sodium     TAKE these medications   hydrocortisone cream 1 % Apply 1 application topically 2 (two) times daily as needed (eczema).   ibuprofen 600 MG tablet Commonly known as: ADVIL Take 1 tablet (600 mg total) by mouth every 6 (six) hours as  needed for fever or headache. What changed:   medication strength  how much to take  reasons to take this   oxyCODONE-acetaminophen 5-325 MG tablet Commonly known as: Percocet Take 1 tablet by mouth every 4 (four) hours as needed for severe pain.   Sprintec 28 0.25-35 MG-MCG tablet Generic drug: norgestimate-ethinyl estradiol Take 1 tablet by mouth daily.            Discharge Care Instructions  (From admission, onward)  Start     Ordered   07/24/20 0000  Discharge wound care:       Comments: You may apply a light dressing for minor discharge from the incision or to keep waistbands of clothing from rubbing.  You may also have been discharge with a clear dressing in which case this will be removed at your postoperative clinic visit.  You may shower, use soap on your incision.  Avoid baths or soaking the incision in the first 6 weeks following your surgery.Marland Kitchen   07/24/20 8016          Follow-up Information    Gae Dry, MD. Go in 1 week.   Specialty: Obstetrics and Gynecology Why: For Post Op, As Scheduled Contact information: [address] [place] [address] [phone]               Signed: Malachy Mood 07/24/2020, 8:32 AM

## 2020-07-24 NOTE — Plan of Care (Signed)
Adequate for discharge. Patient has made significant progress. PCA pump removed earlier today. Incision appears intact, no drainage noted on dressing. Dressing in place. Patient is comfortable and states her prescriptions have been picked up already.

## 2020-07-24 NOTE — Progress Notes (Signed)
Patient cleared for discharge. Pain is controlled with oral medications at this time. Discharge instructions given to patient, questions answered, states understanding. Discharge packet given to patient, follow-up appointments, medications, precautions reviewed. Understanding stated. IV removed. Patient escorted out by hospital staff.

## 2020-07-26 ENCOUNTER — Telehealth: Payer: Self-pay

## 2020-07-26 MED FILL — Morphine Sulfate IV Soln PF 10 MG/ML: INTRAVENOUS | Qty: 50 | Status: AC

## 2020-07-26 MED FILL — Sodium Chloride IV Soln 0.9%: INTRAVENOUS | Qty: 25 | Status: AC

## 2020-07-26 NOTE — Telephone Encounter (Signed)
Pt's BF called after hour nurse 07/25/20 11:16am stating her incision is having dry blood and having spotting on her dressing; no sxs of fever; no fever on incision.  [phone]  This number had a full mailbox.  Called pt's number; she states she is fine; wound is closed; still has a red spot; adv probably a scab; to keep dressing dry and keep appt on Wednesday.

## 2020-07-27 LAB — SURGICAL PATHOLOGY

## 2020-07-28 ENCOUNTER — Ambulatory Visit (INDEPENDENT_AMBULATORY_CARE_PROVIDER_SITE_OTHER): Payer: BC Managed Care – PPO | Admitting: Obstetrics & Gynecology

## 2020-07-28 ENCOUNTER — Encounter: Payer: Self-pay | Admitting: Obstetrics & Gynecology

## 2020-07-28 ENCOUNTER — Other Ambulatory Visit: Payer: Self-pay

## 2020-07-28 ENCOUNTER — Ambulatory Visit: Payer: Self-pay | Admitting: Obstetrics & Gynecology

## 2020-07-28 VITALS — BP 120/80 | Ht 67.0 in | Wt 187.0 lb

## 2020-07-28 DIAGNOSIS — N83201 Unspecified ovarian cyst, right side: Secondary | ICD-10-CM

## 2020-07-28 DIAGNOSIS — D270 Benign neoplasm of right ovary: Secondary | ICD-10-CM

## 2020-07-28 MED ORDER — OXYCODONE-ACETAMINOPHEN 5-325 MG PO TABS: 1.0000 | ORAL_TABLET | ORAL | 0 refills | Status: DC | PRN

## 2020-07-28 NOTE — Progress Notes (Signed)
  Postoperative Follow-up Patient presents post op from Laparotomy and Left Oophorectomy for Large Dermoid Cyst, 1 week ago. Path: DIAGNOSIS:  A. OVARY, LEFT; OOPHORECTOMY:  - MATURE TERATOMA.  - PORTION OF UNREMARKABLE FALLOPIAN TUBE.  - NEGATIVE FOR MALIGNANCY.  Subjective: Patient reports marked improvement in her preop symptoms. Eating a regular diet without difficulty. Pain is controlled with current analgesics. Medications being used: ibuprofen (OTC) and narcotic analgesics including Percocet.  Activity: sedentary. Patient reports additional symptom's since surgery of None.  Objective: BP 120/80   Ht 5\' 7"  (1.702 m)   Wt 187 lb (84.8 kg)   LMP 07/21/2020   BMI 29.29 kg/m  Physical Exam Constitutional:      General: She is not in acute distress.    Appearance: She is well-developed.  Cardiovascular:     Rate and Rhythm: Normal rate.  Pulmonary:     Effort: Pulmonary effort is normal.  Abdominal:     General: There is no distension.     Palpations: Abdomen is soft.     Tenderness: There is no abdominal tenderness.     Comments: Incision Healing Well   Musculoskeletal:        General: Normal range of motion.  Neurological:     Mental Status: She is alert and oriented to person, place, and time.     Cranial Nerves: No cranial nerve deficit.  Skin:    General: Skin is warm and dry.     Assessment: s/p :  Laparotomy LO stable  Plan: Patient has done well after surgery with no apparent complications.  I have discussed the post-operative course to date, and the expected progress moving forward.  The patient understands what complications to be concerned about.  I will see the patient in routine follow up, or sooner if needed.    Activity plan: No heavy lifting.  Pelvic rest. PAP nv at next post op check Percocet refill done  Hoyt Koch 07/28/2020, 11:41 AM

## 2020-07-29 NOTE — Anesthesia Postprocedure Evaluation (Signed)
Anesthesia Post Note  Patient: Ceyda Peterka Robalino  Procedure(s) Performed: LAPAROTOMY (N/A ) OVARIAN CYSTECTOMY (Right ) OOPHORECTOMY (Left )  Patient location during evaluation: PACU Anesthesia Type: General Level of consciousness: awake and alert Pain management: pain level controlled Vital Signs Assessment: post-procedure vital signs reviewed and stable Respiratory status: spontaneous breathing, nonlabored ventilation and respiratory function stable Cardiovascular status: blood pressure returned to baseline and stable Postop Assessment: no apparent nausea or vomiting Anesthetic complications: no   No complications documented.   Last Vitals:  Vitals:   07/24/20 1132 07/24/20 2019  BP: 110/70 107/61  Pulse: 79 68  Resp: 18 18  Temp: 37.2 C 36.7 C  SpO2: 99% 98%    Last Pain:  Vitals:   07/24/20 2019  TempSrc: Oral  PainSc:                  Alphonsus Sias

## 2020-07-30 ENCOUNTER — Telehealth: Payer: Self-pay

## 2020-07-30 NOTE — Telephone Encounter (Signed)
If she is hydrating well and taking good PO and nothing works, she should go to the ER.  I can't really say what is going on or whether it is related to her surgery.  There is nothing else I can give her for pain. She is taking some heavy hitters.

## 2020-07-30 NOTE — Telephone Encounter (Signed)
Pt called the after hour nurse 07/29/20 6pm c/o feeling mild nausea and general fatigue and headaches since yesterday.  Medication not helping, taking oxycodone and IBU.  States H/A is her worst symptom 6/10, constant, using ice pack, no relief.  [phone]  Mailbox is full.

## 2020-07-30 NOTE — Telephone Encounter (Signed)
Is she having any fevers? Is she voiding? Having normal bowel movements? Any vaginal bleeding?

## 2020-07-30 NOTE — Telephone Encounter (Signed)
Pt states no fever, is voiding well, having BMs, no vaginal bleeding.  Wants you to know she has been taking the oxycodone q4h since the surgery and takes the IBU when she has a H/A.  Adv I would let you know.

## 2020-07-30 NOTE — Telephone Encounter (Signed)
Pt aware.  Adv can also take too much pain med and it not help c pain.  sugg taking oxy 9, 1, 5, and 9 and the IUB at 10, 4, and 10.

## 2020-07-30 NOTE — Telephone Encounter (Signed)
Pt called; is having pain after surgery - bad H/As and muscle pain that medication isn't helping c; can she have something else to take or what to do?  [phone]

## 2020-08-02 ENCOUNTER — Telehealth: Payer: Self-pay

## 2020-08-02 NOTE — Telephone Encounter (Signed)
Pt calling; needs PA for oxycodone so she doesn't pay full price.  Ins won't pay without it.  [phone]

## 2020-08-03 NOTE — Telephone Encounter (Signed)
No additional notes

## 2020-08-06 NOTE — Telephone Encounter (Signed)
PA was started in CoverMyMeds 8/18; called [phone] to ck on status; it was still pending pharmacist review; adv this pt had surgery and is in pain; they put 'URGENT' on it and states we should have an answer later today or tomrrow.

## 2020-08-13 ENCOUNTER — Ambulatory Visit: Payer: Self-pay | Admitting: Obstetrics & Gynecology

## 2020-08-17 NOTE — Telephone Encounter (Signed)
PA denied per CoverMyMeds.

## 2020-08-18 ENCOUNTER — Telehealth: Payer: Self-pay

## 2020-08-18 DIAGNOSIS — Z23 Encounter for immunization: Secondary | ICD-10-CM

## 2020-08-18 NOTE — Telephone Encounter (Signed)
Pt calling; s/p surgery; FMLA states she returns to work on the 7th but her appt c [facility] isn't until 15th.  Pt needs a note stating either she can on the 7th or the 15th after appt.  [phone]

## 2020-08-18 NOTE — Telephone Encounter (Signed)
Pt states she would feel better returning to work after appt c [place].  Note is up front for pt p/u.

## 2020-09-01 ENCOUNTER — Encounter: Payer: Self-pay | Admitting: Obstetrics & Gynecology

## 2020-09-01 ENCOUNTER — Other Ambulatory Visit: Payer: Self-pay

## 2020-09-01 ENCOUNTER — Other Ambulatory Visit (HOSPITAL_COMMUNITY)
Admission: RE | Admit: 2020-09-01 | Discharge: 2020-09-01 | Disposition: A | Discharge status: Home / Self Care | Payer: BC Managed Care – PPO | Source: Ambulatory Visit | Attending: Obstetrics & Gynecology | Admitting: Obstetrics & Gynecology

## 2020-09-01 ENCOUNTER — Ambulatory Visit (INDEPENDENT_AMBULATORY_CARE_PROVIDER_SITE_OTHER): Payer: BC Managed Care – PPO | Admitting: Obstetrics & Gynecology

## 2020-09-01 VITALS — BP 120/80 | Ht 67.0 in | Wt 193.0 lb

## 2020-09-01 DIAGNOSIS — Z9889 Other specified postprocedural states: Secondary | ICD-10-CM

## 2020-09-01 DIAGNOSIS — Z124 Encounter for screening for malignant neoplasm of cervix: Secondary | ICD-10-CM

## 2020-09-01 NOTE — Progress Notes (Signed)
  Postoperative Follow-up Patient presents post op from Laparotomy LO for Dermoid for cyst and pain, 1 month ago.  Subjective: Patient reports marked improvement in her preop symptoms. Eating a regular diet without difficulty. The patient is not having any pain.  Activity: normal activities of daily living. Patient reports additional symptom's since surgery of None.  Objective: BP 120/80   Ht 5\' 7"  (1.702 m)   Wt 193 lb (87.5 kg)   LMP 08/13/2020   BMI 30.23 kg/m  Physical Exam Constitutional:      General: She is not in acute distress.    Appearance: She is well-developed.  Genitourinary:     Pelvic exam was performed with patient supine.     Vagina, uterus and rectum normal.     No vaginal erythema or bleeding.     No cervical motion tenderness, discharge or lesion.     Uterus is not enlarged or tender.     No uterine mass detected.    Uterus is midaxial.     No right or left adnexal mass present.     Right adnexa not tender.     Left adnexa not tender.  Cardiovascular:     Rate and Rhythm: Normal rate.  Pulmonary:     Effort: Pulmonary effort is normal.  Abdominal:     General: There is no distension.     Palpations: Abdomen is soft.     Tenderness: There is no abdominal tenderness.     Comments: Incision healing well.  Musculoskeletal:        General: Normal range of motion.  Neurological:     Mental Status: She is alert and oriented to person, place, and time.     Cranial Nerves: No cranial nerve deficit.  Skin:    General: Skin is warm and dry.     Assessment: s/p :  Laparotomy w LO progressing well  Plan: Patient has done well after surgery with no apparent complications.  I have discussed the post-operative course to date, and the expected progress moving forward.  The patient understands what complications to be concerned about.  I will see the patient in routine follow up, or sooner if needed.    Activity plan: No restriction. PAP today  Hoyt Koch 09/01/2020, 10:54 AM

## 2020-09-03 LAB — CYTOLOGY - PAP
Chlamydia: NEGATIVE
Comment: NEGATIVE
Comment: NEGATIVE
Comment: NORMAL
Diagnosis: NEGATIVE
Neisseria Gonorrhea: NEGATIVE
Trichomonas: NEGATIVE

## 2020-09-10 ENCOUNTER — Other Ambulatory Visit: Payer: Self-pay

## 2020-09-10 DIAGNOSIS — Z5321 Procedure and treatment not carried out due to patient leaving prior to being seen by health care provider: Secondary | ICD-10-CM | POA: Insufficient documentation

## 2020-09-10 DIAGNOSIS — R1030 Lower abdominal pain, unspecified: Secondary | ICD-10-CM | POA: Diagnosis not present

## 2020-09-10 LAB — CBC
HCT: 34.3 % — ABNORMAL LOW (ref 36.0–46.0)
Hemoglobin: 12.2 g/dL (ref 12.0–15.0)
MCH: 29.9 pg (ref 26.0–34.0)
MCHC: 35.6 g/dL (ref 30.0–36.0)
MCV: 84.1 fL (ref 80.0–100.0)
Platelets: 485 10*3/uL — ABNORMAL HIGH (ref 150–400)
RBC: 4.08 MIL/uL (ref 3.87–5.11)
RDW: 12.2 % (ref 11.5–15.5)
WBC: 9.4 10*3/uL (ref 4.0–10.5)
nRBC: 0 % (ref 0.0–0.2)

## 2020-09-10 LAB — COMPREHENSIVE METABOLIC PANEL
ALT: 36 U/L (ref 0–44)
AST: 20 U/L (ref 15–41)
Albumin: 3.8 g/dL (ref 3.5–5.0)
Alkaline Phosphatase: 50 U/L (ref 38–126)
Anion gap: 9 (ref 5–15)
BUN: 13 mg/dL (ref 6–20)
CO2: 24 mmol/L (ref 22–32)
Calcium: 9.1 mg/dL (ref 8.9–10.3)
Chloride: 105 mmol/L (ref 98–111)
Creatinine, Ser: 0.67 mg/dL (ref 0.44–1.00)
GFR calc Af Amer: >60 mL/min (ref >60)
GFR calc non Af Amer: >60 mL/min (ref >60)
Glucose, Bld: 102 mg/dL — ABNORMAL HIGH (ref 70–99)
Potassium: 3.5 mmol/L (ref 3.5–5.1)
Sodium: 138 mmol/L (ref 135–145)
Total Bilirubin: 0.5 mg/dL (ref 0.3–1.2)
Total Protein: 7.1 g/dL (ref 6.5–8.1)

## 2020-09-10 LAB — LIPASE, BLOOD: Lipase: 32 U/L (ref 11–51)

## 2020-09-10 NOTE — Addendum Note (Signed)
Addended by: Cleophas Dunker D on: 09/10/2020 09:27 AM   Modules accepted: Level of Service, SmartSet

## 2020-09-10 NOTE — ED Triage Notes (Signed)
Pt comes via POV from home with c/o lower abdominal pain following cyst removal. Pt states she had this a month ago.  Pt states this pain started this am and has gotten worse. Pt denies any N/V/D

## 2020-09-10 NOTE — Progress Notes (Signed)
This encounter was created in error - please disregard.

## 2020-09-11 ENCOUNTER — Emergency Department
Admission: EM | Admit: 2020-09-11 | Discharge: 2020-09-11 | Disposition: A | Discharge status: Home / Self Care | Payer: BC Managed Care – PPO | Attending: Emergency Medicine | Admitting: Emergency Medicine

## 2020-09-11 DIAGNOSIS — R109 Unspecified abdominal pain: Secondary | ICD-10-CM | POA: Diagnosis not present

## 2020-09-11 DIAGNOSIS — Z90721 Acquired absence of ovaries, unilateral: Secondary | ICD-10-CM | POA: Diagnosis not present

## 2020-09-11 DIAGNOSIS — R1030 Lower abdominal pain, unspecified: Secondary | ICD-10-CM | POA: Diagnosis not present

## 2020-09-11 DIAGNOSIS — R1032 Left lower quadrant pain: Secondary | ICD-10-CM | POA: Diagnosis not present

## 2020-09-11 NOTE — ED Notes (Signed)
No answer several times for repeat vital signs.

## 2020-09-13 ENCOUNTER — Telehealth: Payer: Self-pay

## 2020-09-13 NOTE — Telephone Encounter (Signed)
Pt was called to f/u from ED visit as she was told to f/u c [place]; pt states she is fine now; tx'd to [facility] for scheduling.

## 2020-09-21 ENCOUNTER — Other Ambulatory Visit: Payer: Self-pay

## 2020-09-21 ENCOUNTER — Ambulatory Visit (INDEPENDENT_AMBULATORY_CARE_PROVIDER_SITE_OTHER): Payer: BC Managed Care – PPO | Admitting: Obstetrics & Gynecology

## 2020-09-21 ENCOUNTER — Encounter: Payer: Self-pay | Admitting: Obstetrics & Gynecology

## 2020-09-21 VITALS — BP 110/70 | Ht 67.0 in | Wt 199.0 lb

## 2020-09-21 DIAGNOSIS — N920 Excessive and frequent menstruation with regular cycle: Secondary | ICD-10-CM

## 2020-09-21 DIAGNOSIS — R1032 Left lower quadrant pain: Secondary | ICD-10-CM | POA: Diagnosis not present

## 2020-09-21 MED ORDER — MELOXICAM 7.5 MG PO TABS: 7.5000 mg | ORAL_TABLET | Freq: Every day | ORAL | 0 refills | Status: DC

## 2020-09-21 NOTE — Patient Instructions (Signed)
Thank you for choosing [facility]. As part of our ongoing efforts to improve patient experience, we would appreciate your feedback. Please fill out the short survey that you will receive by mail or MyChart. Your opinion is important to [place]! -Dr Kenton Kingfisher   Etonogestrel implant What is this medicine? ETONOGESTREL (et oh noe JES trel) is a contraceptive (birth control) device. It is used to prevent pregnancy. It can be used for up to 3 years. This medicine may be used for other purposes; ask your health care provider or pharmacist if you have questions. COMMON BRAND NAME(S): Implanon, Nexplanon What should I tell my health care provider before I take this medicine? They need to know if you have any of these conditions:  abnormal vaginal bleeding  blood vessel disease or blood clots  breast, cervical, endometrial, ovarian, liver, or uterine cancer  diabetes  gallbladder disease  heart disease or recent heart attack  high blood pressure  high cholesterol or triglycerides  kidney disease  liver disease  migraine headaches  seizures  stroke  tobacco smoker  an unusual or allergic reaction to etonogestrel, anesthetics or antiseptics, other medicines, foods, dyes, or preservatives  pregnant or trying to get pregnant  breast-feeding How should I use this medicine? This device is inserted just under the skin on the inner side of your upper arm by a health care professional. Talk to your pediatrician regarding the use of this medicine in children. Special care may be needed. Overdosage: If you think you have taken too much of this medicine contact a poison control center or emergency room at once. NOTE: This medicine is only for you. Do not share this medicine with others. What if I miss a dose? This does not apply. What may interact with this medicine? Do not take this medicine with any of the following medications:  amprenavir  fosamprenavir This medicine may also  interact with the following medications:  acitretin  aprepitant  armodafinil  bexarotene  bosentan  carbamazepine  certain medicines for fungal infections like fluconazole, ketoconazole, itraconazole and voriconazole  certain medicines to treat hepatitis, HIV or AIDS  cyclosporine  felbamate  griseofulvin  lamotrigine  modafinil  oxcarbazepine  phenobarbital  phenytoin  primidone  rifabutin  rifampin  rifapentine  St. John's wort  topiramate This list may not describe all possible interactions. Give your health care provider a list of all the medicines, herbs, non-prescription drugs, or dietary supplements you use. Also tell them if you smoke, drink alcohol, or use illegal drugs. Some items may interact with your medicine. What should I watch for while using this medicine? This product does not protect you against HIV infection (AIDS) or other sexually transmitted diseases. You should be able to feel the implant by pressing your fingertips over the skin where it was inserted. Contact your doctor if you cannot feel the implant, and use a non-hormonal birth control method (such as condoms) until your doctor confirms that the implant is in place. Contact your doctor if you think that the implant may have broken or become bent while in your arm. You will receive a user card from your health care provider after the implant is inserted. The card is a record of the location of the implant in your upper arm and when it should be removed. Keep this card with your health records. What side effects may I notice from receiving this medicine? Side effects that you should report to your doctor or health care professional as soon as  possible:  allergic reactions like skin rash, itching or hives, swelling of the face, lips, or tongue  breast lumps, breast tissue changes, or discharge  breathing problems  changes in emotions or moods  coughing up blood  if you feel that  the implant may have broken or bent while in your arm  high blood pressure  pain, irritation, swelling, or bruising at the insertion site  scar at site of insertion  signs of infection at the insertion site such as fever, and skin redness, pain or discharge  signs and symptoms of a blood clot such as breathing problems; changes in vision; chest pain; severe, sudden headache; pain, swelling, warmth in the leg; trouble speaking; sudden numbness or weakness of the face, arm or leg  signs and symptoms of liver injury like dark yellow or brown urine; general ill feeling or flu-like symptoms; light-colored stools; loss of appetite; nausea; right upper belly pain; unusually weak or tired; yellowing of the eyes or skin  unusual vaginal bleeding, discharge Side effects that usually do not require medical attention (report to your doctor or health care professional if they continue or are bothersome):  acne  breast pain or tenderness  headache  irregular menstrual bleeding  nausea This list may not describe all possible side effects. Call your doctor for medical advice about side effects. You may report side effects to FDA at 1-800-FDA-1088. Where should I keep my medicine? This drug is given in a hospital or clinic and will not be stored at home. NOTE: This sheet is a summary. It may not cover all possible information. If you have questions about this medicine, talk to your doctor, pharmacist, or health care provider.  2020 Elsevier/Gold Standard (2019-09-16 11:33:04)

## 2020-09-21 NOTE — Progress Notes (Signed)
Gynecology Pelvic Pain Evaluation   Chief Complaint  Patient presents with  . Follow-up    left side pain     History of Present Illness:   Patient is a 24 y.o. G0P0000 who LMP was Patient's last menstrual period was 09/20/2020., presents today for a problem visit.  She complains of pain.   Her pain is localized to the LLQ area, described as intermittent, sharp and stabbing, began several days ago and its severity is described as moderate. The pain radiates to the  Non-radiating. She has these associated symptoms which include none. Patient has these modifiers which include Staaying off her feet and not lifting as much at work that make it better and prolonged standing and lifting (which she does at work) that make it worse.  Context includes: she had laparotomy for large cyst removal (20 cm) 8 weeks ago.  She notes last period was heavier and more prolonged.  She had a recent US and CT [facility] based ER) that was read as normal post op changes and no new cysts, mass, abscess, other etiology.  PMHx: She  has a past medical history of Asthma, Borderline personality disorder ([place]), and Depression. Also,  has a past surgical history that includes laparotomy (N/A, 07/22/2020); Ovarian cyst removal (Right, 07/22/2020); and Oophorectomy (Left, 07/22/2020)., family history includes Lung cancer in her paternal grandfather.,  reports that she has been smoking. She has never used smokeless tobacco. She reports previous alcohol use. She reports current drug use. Drug: Marijuana.  She has a current medication list which includes the following prescription(s): meloxicam and oxycodone-acetaminophen. Also, is allergic to shellfish allergy.  Review of Systems  Constitutional: Negative for chills, fever and malaise/fatigue.  HENT: Negative for congestion, sinus pain and sore throat.   Eyes: Negative for blurred vision and pain.  Respiratory: Negative for cough and wheezing.   Cardiovascular: Negative for chest pain  and leg swelling.  Gastrointestinal: Positive for abdominal pain. Negative for constipation, diarrhea, heartburn, nausea and vomiting.  Genitourinary: Negative for dysuria, frequency, hematuria and urgency.  Musculoskeletal: Negative for back pain, joint pain, myalgias and neck pain.  Skin: Negative for itching and rash.  Neurological: Negative for dizziness, tremors and weakness.  Endo/Heme/Allergies: Does not bruise/bleed easily.  Psychiatric/Behavioral: Negative for depression. The patient is not nervous/anxious and does not have insomnia.     Objective: BP 110/70   Ht 5\' 7"  (1.702 m)   Wt 199 lb (90.3 kg)   LMP 09/20/2020   BMI 31.17 kg/m  Physical Exam Constitutional:      General: She is not in acute distress.    Appearance: She is well-developed.  Abdominal:     General: Bowel sounds are normal.     Palpations: Abdomen is soft.     Tenderness: There is generalized abdominal tenderness. There is no rebound. Negative signs include Murphy's sign and McBurney's sign.     Hernia: No hernia is present.  Musculoskeletal:        General: Normal range of motion.  Neurological:     Mental Status: She is alert and oriented to person, place, and time.  Skin:    General: Skin is warm and dry.  Vitals reviewed.    Assessment: 24 y.o. G0P0000 with LLQ pain after surgery still in recovery, no sign/symptom of other more concerning etiology.  She also is having periods again w some menorrhagia and dysmenorrhea..  1. LLQ pain Meloxicam daily for 2 weeks then taper off, as she continues to heal  from large operation  2. Menorrhagia with regular cycle Hormonal options d/w pt for periods and birth control OCP vs Nexplanon as choices she may prefer. Will schedule Nexplanon soon if desires.  She is on period now.  Barnett Applebaum, MD, [facility], [facility] 09/21/2020  3:22 PM

## 2021-02-01 ENCOUNTER — Encounter: Payer: BC Managed Care – PPO | Admitting: Advanced Practice Midwife

## 2021-02-01 DIAGNOSIS — Z3201 Encounter for pregnancy test, result positive: Secondary | ICD-10-CM | POA: Diagnosis not present

## 2021-02-01 DIAGNOSIS — M25511 Pain in right shoulder: Secondary | ICD-10-CM | POA: Diagnosis not present

## 2021-02-09 ENCOUNTER — Encounter: Payer: BC Managed Care – PPO | Admitting: Advanced Practice Midwife

## 2021-02-09 ENCOUNTER — Ambulatory Visit (INDEPENDENT_AMBULATORY_CARE_PROVIDER_SITE_OTHER): Payer: BC Managed Care – PPO | Admitting: Obstetrics and Gynecology

## 2021-02-09 ENCOUNTER — Other Ambulatory Visit: Payer: Self-pay

## 2021-02-09 ENCOUNTER — Encounter: Payer: Self-pay | Admitting: Obstetrics and Gynecology

## 2021-02-09 ENCOUNTER — Encounter: Payer: BC Managed Care – PPO | Admitting: Obstetrics and Gynecology

## 2021-02-09 VITALS — BP 118/70 | Ht 67.0 in | Wt 208.2 lb

## 2021-02-09 DIAGNOSIS — N926 Irregular menstruation, unspecified: Secondary | ICD-10-CM | POA: Diagnosis not present

## 2021-02-09 LAB — POCT URINALYSIS DIPSTICK OB
Glucose, UA: NEGATIVE
POC,PROTEIN,UA: NEGATIVE

## 2021-02-09 LAB — POCT URINE PREGNANCY: Preg Test, Ur: POSITIVE — AB

## 2021-02-09 NOTE — Patient Instructions (Addendum)
[facility]  [address] [place], [place] [address]  CALL us Phone: [phone] Fax: [phone]    Medication Abortion Medication abortion is a procedure that uses medicines to end a pregnancy. Hormone-like medicines are used to start labor by making the uterus tighten Radiographer, therapeutic). Medication abortions are noninvasive and can be done during the first trimester of pregnancy. Tell your health care provider about:  Any allergies you have.  All medicines you are taking, including vitamins, herbs, eye drops, creams, and over-the-counter medicines.  Any blood disorders you have.  If you have an intrauterine device (IUD) for birth control.  Any medical conditions you have. What are the risks? Generally, this is a safe procedure. However, problems may occur, including:  Parts of the placenta and other afterbirth may remain in your uterus. If this happens, you may need to have a procedure where the inside of your uterus is scraped to remove it.  Heavy bleeding.  Infection.  Failure to end the pregnancy. If this happens, your health provider will discuss possible risks and other ways of treatment.  Temporary side effects from the medicine. These may include: ? Headache. ? Nausea and vomiting. ? Diarrhea. ? Feeling dizzy. ? Fever. ? Chills. What happens before the procedure?  You may have a pelvic exam. This is an exam of your outer and inner genitals and reproductive organs.  You may have a blood test to confirm that you are pregnant.  You may have an ultrasound of your uterus.  After discussing the procedure with your health care provider, you will be asked to sign a consent form.  Ask your health care provider about changing or stopping your regular medicines. This is especially important if you are taking diabetes medicines or blood thinners.   What happens during the procedure?  You will take medicines in tablet form by mouth. After you take the medicines,  you will be able to go home.  The medicine will induce bleeding that may last for a few hours or up to a few days, similar to a heavy menstrual period.  Within 1-3 days of taking this medicine, you will be instructed to take more medicine by mouth or by putting it directly into your vagina (suppository). Follow your health care provider's instructions carefully.  If you are Rh negative, you will be given Rh immunoglobulin to prevent Rh sensitization. The procedure may vary among health care providers and hospitals. What happens after the procedure?  You may have cramps for a few days. They may feel like menstrual cramps.  You will continue to have bleeding that may last up to a few days. The bleeding is similar to a heavy menstrual period.  Your health care provider will make a plan with you for follow-up. In person follow-up may not be necessary if you do not have any problems from the procedure. Summary  Medication abortion is a procedure that uses medicines to end a pregnancy.  This is a safe procedure. However, there are potential risks, including parts of the placenta and other afterbirth remaining in your uterus, and failure to end the pregnancy.  You will be given medicine in tablet form to take by mouth. After you take the medicine, you will be able to go home. Bleeding will begin that may last for a few hours or up to a few days.  Your health care provider will make a plan with you for follow-up. This information is not intended to replace advice given to you by  your health care provider. Make sure you discuss any questions you have with your health care provider. Document Revised: 09/23/2020 Document Reviewed: 09/23/2020 Elsevier Patient Education  2021 [employer].

## 2021-02-09 NOTE — Progress Notes (Signed)
Patient ID: Veronica Clay, adult   DOB: 1996-10-07, 25 y.o.   MRN: [id number]  Reason for Consult: Missed period, early pregnancy  Referred by No ref. provider found  Subjective:     HPI:  Veronica Clay is a 25 y.o. adult. She is here for a NOB visit, but she expresses that she desires to have a termination. Her LMP is 12/26/2020. She has a regular monthly cycle. She has stopped her OCP for a procedure and desired to transition to a Nexplanon but had a unprotected gap in her contraception which resulted in this pregnancy. She is having normal nausea of pregnancy.   Gynecological History  LMP: 12/26/2020 Describes periods as regular, monthly Last pap smear: 2021 NIL History of STDs: denies Sexually Active: yes  Obstetrical History OB History  Gravida Para Term Preterm AB Living  1 0 0 0 0 0  SAB IAB Ectopic Multiple Live Births  0 0 0 0 0    # Outcome Date GA Lbr Len/2nd Weight Sex Delivery Anes PTL Lv  1 Current              Past Medical History:  Diagnosis Date  . Asthma   . Borderline personality disorder ([place])   . Depression    Family History  Problem Relation Age of Onset  . Lung cancer Paternal Grandfather    Past Surgical History:  Procedure Laterality Date  . LAPAROTOMY N/A 07/22/2020   Procedure: LAPAROTOMY;  Surgeon: Gae Dry, MD;  Location: [facility] ORS;  Service: Gynecology;  Laterality: N/A;  . OOPHORECTOMY Left 07/22/2020   Procedure: OOPHORECTOMY;  Surgeon: Gae Dry, MD;  Location: [facility] ORS;  Service: Gynecology;  Laterality: Left;  . OVARIAN CYST REMOVAL Right 07/22/2020   Procedure: OVARIAN CYSTECTOMY;  Surgeon: Gae Dry, MD;  Location: [facility] ORS;  Service: Gynecology;  Laterality: Right;    Short Social History:  Social History   Tobacco Use  . Smoking status: Current Some Day Smoker  . Smokeless tobacco: Never Used  Substance Use Topics  . Alcohol use: Not Currently    Allergies  Allergen Reactions  . Shellfish  Allergy Anaphylaxis    Tongue swelling    Current Outpatient Medications  Medication Sig Dispense Refill  . meloxicam (MOBIC) 7.5 MG tablet Take 1 tablet (7.5 mg total) by mouth daily. Take daily for 14 days then every other day. (Patient not taking: Reported on 02/09/2021) 21 tablet 0  . oxyCODONE-acetaminophen (PERCOCET) 5-325 MG tablet Take 1 tablet by mouth every 4 (four) hours as needed for severe pain. (Patient not taking: No sig reported) 30 tablet 0   No current facility-administered medications for this visit.    Review of Systems  Constitutional: Negative for chills, fatigue, fever and unexpected weight change.  HENT: Negative for trouble swallowing.  Eyes: Negative for loss of vision.  Respiratory: Negative for cough, shortness of breath and wheezing.  Cardiovascular: Negative for chest pain, leg swelling, palpitations and syncope.  GI: Negative for abdominal pain, blood in stool, diarrhea, nausea and vomiting.  GU: Negative for difficulty urinating, dysuria, frequency and hematuria.  Musculoskeletal: Negative for back pain, leg pain and joint pain.  Skin: Negative for rash.  Neurological: Negative for dizziness, headaches, light-headedness, numbness and seizures.  Psychiatric: Negative for behavioral problem, confusion, depressed mood and sleep disturbance.        Objective:  Objective   Vitals:   02/09/21 0951  BP: 118/70  Weight: 208 lb 3.2 oz (94.4  kg)  Height: 5\' 7"  (1.702 m)   Body mass index is 32.61 kg/m.  Physical Exam Vitals and nursing note reviewed. Exam conducted with a chaperone present.  Constitutional:      Appearance: Veronica Clay is well-developed and well-nourished.  HENT:     Head: Normocephalic and atraumatic.  Eyes:     Extraocular Movements: EOM normal.     Pupils: Pupils are equal, round, and reactive to light.  Cardiovascular:     Rate and Rhythm: Normal rate and regular rhythm.  Pulmonary:     Effort: Pulmonary effort is  normal. No respiratory distress.  Skin:    General: Skin is warm and dry.  Neurological:     Mental Status: Veronica Clay is alert and oriented to person, place, and time.  Psychiatric:        Mood and Affect: Mood and affect normal.        Behavior: Behavior normal.        Thought Content: Thought content normal.        Judgment: Judgment normal.     Assessment/Plan:     25 yo on bedside US. Early gestational sac and yolk sac seen. LMP would date her as 7 weeks 4 days, [place] today suggest she is less than 5 weeks 5 days.  Discussed how [facility]. Provided information on medical abortion.  Discussed reasons to seek follow up care.  Provided with information regarding contraction choices and Nexplanon. Patient could have that placed after the termination at [facility]renthood or here if desired.   Follow up if needed.   More than 20 minutes were spent face to face with the patient in the room, reviewing the medical record, labs and images, and coordinating care for the patient. The plan of management was discussed in detail and counseling was provided.     Adrian Prows MD [facility], [facility] 02/09/2021 1:00 PM

## 2021-02-23 ENCOUNTER — Emergency Department: Payer: BC Managed Care – PPO

## 2021-02-23 ENCOUNTER — Emergency Department
Admission: EM | Admit: 2021-02-23 | Discharge: 2021-02-23 | Disposition: A | Discharge status: Home / Self Care | Payer: BC Managed Care – PPO | Attending: Emergency Medicine | Admitting: Emergency Medicine

## 2021-02-23 ENCOUNTER — Other Ambulatory Visit: Payer: Self-pay

## 2021-02-23 DIAGNOSIS — O039 Complete or unspecified spontaneous abortion without complication: Secondary | ICD-10-CM | POA: Insufficient documentation

## 2021-02-23 DIAGNOSIS — J45909 Unspecified asthma, uncomplicated: Secondary | ICD-10-CM | POA: Insufficient documentation

## 2021-02-23 DIAGNOSIS — F17200 Nicotine dependence, unspecified, uncomplicated: Secondary | ICD-10-CM | POA: Diagnosis not present

## 2021-02-23 DIAGNOSIS — O209 Hemorrhage in early pregnancy, unspecified: Secondary | ICD-10-CM | POA: Diagnosis not present

## 2021-02-23 DIAGNOSIS — Z3A00 Weeks of gestation of pregnancy not specified: Secondary | ICD-10-CM | POA: Diagnosis not present

## 2021-02-23 DIAGNOSIS — N939 Abnormal uterine and vaginal bleeding, unspecified: Secondary | ICD-10-CM | POA: Diagnosis not present

## 2021-02-23 DIAGNOSIS — R102 Pelvic and perineal pain: Secondary | ICD-10-CM

## 2021-02-23 LAB — CBC
HCT: 35.4 % — ABNORMAL LOW (ref 36.0–46.0)
Hemoglobin: 12.3 g/dL (ref 12.0–15.0)
MCH: 29.5 pg (ref 26.0–34.0)
MCHC: 34.7 g/dL (ref 30.0–36.0)
MCV: 84.9 fL (ref 80.0–100.0)
Platelets: 487 10*3/uL — ABNORMAL HIGH (ref 150–400)
RBC: 4.17 MIL/uL (ref 3.87–5.11)
RDW: 12.8 % (ref 11.5–15.5)
WBC: 8.5 10*3/uL (ref 4.0–10.5)
nRBC: 0 % (ref 0.0–0.2)

## 2021-02-23 LAB — BASIC METABOLIC PANEL
Anion gap: 8 (ref 5–15)
BUN: 9 mg/dL (ref 6–20)
CO2: 26 mmol/L (ref 22–32)
Calcium: 9.2 mg/dL (ref 8.9–10.3)
Chloride: 104 mmol/L (ref 98–111)
Creatinine, Ser: 0.63 mg/dL (ref 0.44–1.00)
GFR, Estimated: >60 mL/min (ref >60)
Glucose, Bld: 108 mg/dL — ABNORMAL HIGH (ref 70–99)
Potassium: 3.4 mmol/L — ABNORMAL LOW (ref 3.5–5.1)
Sodium: 138 mmol/L (ref 135–145)

## 2021-02-23 LAB — ABO/RH: ABO/RH(D): O POS

## 2021-02-23 LAB — HCG, QUANTITATIVE, PREGNANCY: hCG, Beta Chain, Quant, S: 6159 m[IU]/mL — ABNORMAL HIGH (ref <5)

## 2021-02-23 MED ORDER — OXYCODONE-ACETAMINOPHEN 5-325 MG PO TABS
1.0000 | ORAL_TABLET | Freq: Once | ORAL | Status: AC
  Administered 2021-02-23: 1 via ORAL
  Filled 2021-02-23: qty 1

## 2021-02-23 NOTE — ED Triage Notes (Addendum)
Pt comes pov with vaginal bleeding while pregnant. Pt was at the obygn on the 23rd and they were unable to see baby on [place] due to being so early. Pt started having vaginal spotting 5 days ago then today started having large amounts of bleeding. LMP 12/26/2020. Pt also having a lot of cramping. First pregnancy.

## 2021-02-23 NOTE — ED Provider Notes (Signed)
[facility] Emergency Department Provider Note  ____________________________________________   Event Date/Time   First MD Initiated Contact with Patient 02/23/21 1502     (approximate)  I have reviewed the triage vital signs and the nursing notes.   HISTORY  Chief Complaint Vaginal Bleeding    HPI Veronica Clay is a 25 y.o. adult presents emergency department with vaginal bleeding while pregnant.  Patient states that the OB/GYN told her on 23 February they were unable to see the baby on ultrasound because they thought it was too early.  Since then she has had some vaginal spotting for about 5 days, large amount of bleeding overnight and today she is going through a large maxi pad every 2 hours.   Some abdominal pain.  Some cramping.  No fever or chills   Past Medical History:  Diagnosis Date  . Asthma   . Borderline personality disorder ([place])   . Depression     Patient Active Problem List   Diagnosis Date Noted  . Right ovarian cyst 07/22/2020  . Pelvic mass   . Ovarian cyst, left     Past Surgical History:  Procedure Laterality Date  . LAPAROTOMY N/A 07/22/2020   Procedure: LAPAROTOMY;  Surgeon: Gae Dry, MD;  Location: [facility] ORS;  Service: Gynecology;  Laterality: N/A;  . OOPHORECTOMY Left 07/22/2020   Procedure: OOPHORECTOMY;  Surgeon: Gae Dry, MD;  Location: [facility] ORS;  Service: Gynecology;  Laterality: Left;  . OVARIAN CYST REMOVAL Right 07/22/2020   Procedure: OVARIAN CYSTECTOMY;  Surgeon: Gae Dry, MD;  Location: [facility] ORS;  Service: Gynecology;  Laterality: Right;    Prior to Admission medications   Medication Sig Start Date End Date Taking? Authorizing Provider  meloxicam (MOBIC) 7.5 MG tablet Take 1 tablet (7.5 mg total) by mouth daily. Take daily for 14 days then every other day. Patient not taking: Reported on 02/09/2021 09/21/20   Gae Dry, MD  oxyCODONE-acetaminophen (PERCOCET) 5-325 MG tablet Take 1  tablet by mouth every 4 (four) hours as needed for severe pain. Patient not taking: No sig reported 07/28/20 07/28/21  Gae Dry, MD    Allergies Shellfish allergy  Family History  Problem Relation Age of Onset  . Lung cancer Paternal Grandfather     Social History Social History   Tobacco Use  . Smoking status: Current Some Day Smoker  . Smokeless tobacco: Never Used  Vaping Use  . Vaping Use: Former  Substance Use Topics  . Alcohol use: Not Currently  . Drug use: Yes    Types: Marijuana    Review of Systems  Constitutional: No fever/chills Eyes: No visual changes. ENT: No sore throat. Respiratory: Denies cough Cardiovascular: Denies chest pain Gastrointestinal: Denies abdominal pain Genitourinary: Negative for dysuria.  Vaginal bleeding Musculoskeletal: Negative for back pain. Skin: Negative for rash. Psychiatric: no mood changes,     ____________________________________________   PHYSICAL EXAM:  VITAL SIGNS: ED Triage Vitals  Enc Vitals Group     BP 02/23/21 1430 (!) 129/97     Pulse Rate 02/23/21 1430 89     Resp 02/23/21 1430 18     Temp 02/23/21 1430 97.9 F (36.6 C)     Temp Source 02/23/21 1430 Oral     SpO2 02/23/21 1430 100 %     Weight 02/23/21 1431 200 lb (90.7 kg)     Height 02/23/21 1431 5\' 7"  (1.702 m)     Head Circumference --  Peak Flow --      Pain Score 02/23/21 1431 8     Pain Loc --      Pain Edu? --      Excl. in [place]? --     Constitutional: Alert and oriented. Well appearing and in no acute distress. Eyes: Conjunctivae are normal.  Head: Atraumatic. Nose: No congestion/rhinnorhea. Mouth/Throat: Mucous membranes are moist.   Neck:  supple no lymphadenopathy noted Cardiovascular: Normal rate, regular rhythm.  Respiratory: Normal respiratory effort.  No retractions,  Abd: soft nontender bs normal all 4 quad GU: deferred by the patient Musculoskeletal: FROM all extremities, warm and well perfused Neurologic:   Normal speech and language.  Skin:  Skin is warm, dry and intact. No rash noted. Psychiatric: Mood and affect are normal. Speech and behavior are normal.  ____________________________________________   LABS (all labs ordered are listed, but only abnormal results are displayed)  Labs Reviewed  CBC - Abnormal; Notable for the following components:      Result Value   HCT 35.4 (*)    Platelets 487 (*)    All other components within normal limits  BASIC METABOLIC PANEL - Abnormal; Notable for the following components:   Potassium 3.4 (*)    Glucose, Bld 108 (*)    All other components within normal limits  HCG, QUANTITATIVE, PREGNANCY - Abnormal; Notable for the following components:   hCG, Beta Chain, Quant, S 6,159 (*)    All other components within normal limits  ABO/RH   ____________________________________________   ____________________________________________  RADIOLOGY  Ultrasound OB less than 14 weeks  ____________________________________________   PROCEDURES  Procedure(s) performed: No  Procedures    ____________________________________________   INITIAL IMPRESSION / ASSESSMENT AND PLAN / ED COURSE  Pertinent labs & imaging results that were available during my care of the patient were reviewed by me and considered in my medical decision making (see chart for details).   Patient is a 28-year-old pregnant female with vaginal bleeding.  LMP was January 6, see HPI.  Physical exam shows patient appears stable.  DDx: Subchorionic hemorrhage, threatened miscarriage, miscarriage, ectopic  CBC is normal, basic metabolic panel is normal, ABO/Rh is O+ Beta hCG 6150 normal  Ultrasound OB less than 14 weeks  Ultrasound reviewed by me and confirmed by radiology to show no IUP.  Since the patient's had positive beta hCGs and trending downwards along with a heavy vaginal bleeding today would say that she is miscarrying.  Did discuss this with patient.  She is to  follow-up with Dr. Ruthe Mannan office.  He is her regular OB/GYN.  Have a repeat beta-hCG to make sure the numbers are decreasing.  Repeat ultrasound may be needed.  I did tell the patient this.  She is to return emergency department if she has extremely heavy bleeding, pelvic pain, fever or chills.  We did discuss the chance for retained products.  She was discharged in stable condition.  She is given a work note.  Patient is O+ so she will not need RhoGam at this time.    Candida Arvin Collard Robalino was evaluated in Emergency Department on 02/23/2021 for the symptoms described in the history of present illness. Keyuna Reyes Robalino was evaluated in the context of the global COVID-19 pandemic, which necessitated consideration that the patient might be at risk for infection with the SARS-CoV-2 virus that causes COVID-19. Institutional protocols and algorithms that pertain to the evaluation of patients at risk for COVID-19 are in a state of rapid change  based on information released by regulatory bodies including the CDC and federal and state organizations. These policies and algorithms were followed during the patient's care in the ED.    As part of my medical decision making, I reviewed the following data within the [facility] notes reviewed and incorporated, Labs reviewed , Old chart reviewed, Radiograph reviewed , Notes from prior ED visits and [place] Controlled Substance Database  ____________________________________________   FINAL CLINICAL IMPRESSION(S) / ED DIAGNOSES  Final diagnoses:  Miscarriage      NEW MEDICATIONS STARTED DURING THIS VISIT:  Discharge Medication List as of 02/23/2021  5:46 PM       Note:  This document was prepared using Dragon voice recognition software and may include unintentional dictation errors.    Versie Starks, PA-C 02/23/21 1811    Lavonia Drafts, MD 02/23/21 Vernelle Emerald

## 2021-02-23 NOTE — Discharge Instructions (Signed)
Follow-up with [facility]/OB/GYN to have a repeat beta hCG in approximately 1 week.  If you begin to have heavier bleeding for your bleeding through 1 pad per hour please return emergency department

## 2021-02-23 NOTE — ED Notes (Signed)
[facility] SENT FOR ABO/RH IF NEEDED.

## 2021-02-24 ENCOUNTER — Telehealth: Payer: Self-pay

## 2021-02-24 NOTE — Telephone Encounter (Signed)
Patient is scheduled for 4:10 with [facility] 02/25/21

## 2021-02-24 NOTE — Telephone Encounter (Signed)
-----   Message from Gae Dry, MD sent at 02/24/2021  7:12 AM EST ----- Regarding: appt ER f/u [facility]

## 2021-02-25 ENCOUNTER — Ambulatory Visit: Payer: BC Managed Care – PPO | Admitting: Obstetrics & Gynecology

## 2021-02-28 ENCOUNTER — Encounter: Payer: Self-pay | Admitting: Obstetrics & Gynecology

## 2021-02-28 ENCOUNTER — Ambulatory Visit (INDEPENDENT_AMBULATORY_CARE_PROVIDER_SITE_OTHER): Payer: BC Managed Care – PPO | Admitting: Obstetrics & Gynecology

## 2021-02-28 ENCOUNTER — Other Ambulatory Visit: Payer: Self-pay

## 2021-02-28 VITALS — BP 120/80 | Ht 68.0 in | Wt 204.0 lb

## 2021-02-28 DIAGNOSIS — O039 Complete or unspecified spontaneous abortion without complication: Secondary | ICD-10-CM | POA: Diagnosis not present

## 2021-02-28 NOTE — Patient Instructions (Addendum)
Etonogestrel implant What is this medicine? ETONOGESTREL (et oh noe JES trel) is a contraceptive (birth control) device. It is used to prevent pregnancy. It can be used for up to 3 years. This medicine may be used for other purposes; ask your health care provider or pharmacist if you have questions. COMMON BRAND NAME(S): Implanon, Nexplanon What should I tell my health care provider before I take this medicine? They need to know if you have any of these conditions:  abnormal vaginal bleeding  blood vessel disease or blood clots  breast, cervical, endometrial, ovarian, liver, or uterine cancer  diabetes  gallbladder disease  heart disease or recent heart attack  high blood pressure  high cholesterol or triglycerides  kidney disease  liver disease  migraine headaches  seizures  stroke  tobacco smoker  an unusual or allergic reaction to etonogestrel, anesthetics or antiseptics, other medicines, foods, dyes, or preservatives  pregnant or trying to get pregnant  breast-feeding How should I use this medicine? This device is inserted just under the skin on the inner side of your upper arm by a health care professional. Talk to your pediatrician regarding the use of this medicine in children. Special care may be needed. Overdosage: If you think you have taken too much of this medicine contact a poison control center or emergency room at once. NOTE: This medicine is only for you. Do not share this medicine with others. What if I miss a dose? This does not apply. What may interact with this medicine? Do not take this medicine with any of the following medications:  amprenavir  fosamprenavir This medicine may also interact with the following medications:  acitretin  aprepitant  armodafinil  bexarotene  bosentan  carbamazepine  certain medicines for fungal infections like fluconazole, ketoconazole, itraconazole and voriconazole  certain medicines to treat  hepatitis, HIV or AIDS  cyclosporine  felbamate  griseofulvin  lamotrigine  modafinil  oxcarbazepine  phenobarbital  phenytoin  primidone  rifabutin  rifampin  rifapentine  St. John's wort  topiramate This list may not describe all possible interactions. Give your health care provider a list of all the medicines, herbs, non-prescription drugs, or dietary supplements you use. Also tell them if you smoke, drink alcohol, or use illegal drugs. Some items may interact with your medicine. What should I watch for while using this medicine? This product does not protect you against HIV infection (AIDS) or other sexually transmitted diseases. You should be able to feel the implant by pressing your fingertips over the skin where it was inserted. Contact your doctor if you cannot feel the implant, and use a non-hormonal birth control method (such as condoms) until your doctor confirms that the implant is in place. Contact your doctor if you think that the implant may have broken or become bent while in your arm. You will receive a user card from your health care provider after the implant is inserted. The card is a record of the location of the implant in your upper arm and when it should be removed. Keep this card with your health records. What side effects may I notice from receiving this medicine? Side effects that you should report to your doctor or health care professional as soon as possible:  allergic reactions like skin rash, itching or hives, swelling of the face, lips, or tongue  breast lumps, breast tissue changes, or discharge  breathing problems  changes in emotions or moods  coughing up blood  if you feel that the implant   may have broken or bent while in your arm  high blood pressure  pain, irritation, swelling, or bruising at the insertion site  scar at site of insertion  signs of infection at the insertion site such as fever, and skin redness, pain or  discharge  signs and symptoms of a blood clot such as breathing problems; changes in vision; chest pain; severe, sudden headache; pain, swelling, warmth in the leg; trouble speaking; sudden numbness or weakness of the face, arm or leg  signs and symptoms of liver injury like dark yellow or brown urine; general ill feeling or flu-like symptoms; light-colored stools; loss of appetite; nausea; right upper belly pain; unusually weak or tired; yellowing of the eyes or skin  unusual vaginal bleeding, discharge Side effects that usually do not require medical attention (report to your doctor or health care professional if they continue or are bothersome):  acne  breast pain or tenderness  headache  irregular menstrual bleeding  nausea This list may not describe all possible side effects. Call your doctor for medical advice about side effects. You may report side effects to FDA at 1-800-FDA-1088. Where should I keep my medicine? This drug is given in a hospital or clinic and will not be stored at home. NOTE: This sheet is a summary. It may not cover all possible information. If you have questions about this medicine, talk to your doctor, pharmacist, or health care provider.  2021 Elsevier/Gold Standard (2019-09-16 11:33:04)  

## 2021-02-28 NOTE — Progress Notes (Signed)
  Obstetric Problem Visit    Chief Complaint  Patient presents with  . Miscarriage    History of Present Illness: Patient is a 25 y.o. G1P0000 w LMP in January, had [place] in Feb w gest sacx noted, and then [place] recently in ER due to heavy bleeding w no IUP seen; presenting for first trimester bleeding. She still has bleeding but much less.  No pain.  Is bleeding equal to or greater than normal menstrual flow:  Yes Any recent trauma:  No Recent intercourse:  No History of prior miscarriage:  No Prior ultrasound demonstrating IUP:  Yes Prior ultrasound demonstrating viable IUP:  No Prior Serum HCG:  Yes, beta was 6000 on 02/23/21 Rh status: O+  PMHx: She  has a past medical history of Asthma, Borderline personality disorder ([place]), and Depression. Also,  has a past surgical history that includes laparotomy (N/A, 07/22/2020); Ovarian cyst removal (Right, 07/22/2020); and Oophorectomy (Left, 07/22/2020)., family history includes Lung cancer in [place] paternal grandfather.,  reports that [name] has been smoking. Veronica Clay has never used smokeless tobacco. Veronica Clay reports previous alcohol use. Veronica Clay reports current drug use. Drug: Marijuana.  She currently has no medications in their medication list. Also, is allergic to shellfish allergy.  Review of Systems  All other systems reviewed and are negative.   Objective: Vitals:   02/28/21 0822  BP: 120/80   Physical Exam Constitutional:      General: Veronica Clay is not in acute distress.    Appearance: Veronica Clay is well-developed.  Musculoskeletal:        General: Normal range of motion.  Neurological:     Mental Status: Veronica Clay is alert and oriented to person, place, and time.  Skin:    General: Skin is warm and dry.  Vitals reviewed.     Assessment: 25 y.o. G1P0000  1. Complete abortion - Beta hCG quant (ref lab) today to see numbers come down properly -  Await for bleeding to finish as well; monitor for cycles thereafter - Pt desires birth control and desires Nexplanon (was planning for it in past and still desires this)  The patient is Rh O+, rhogam is therefore not indicated to decrease the risk rhesus alloimmunization.    Routine bleeding precautions were discussed with the patient prior the conclusion of today's visit.  Barnett Applebaum, MD, [facility], [facility] 02/28/2021  9:05 AM

## 2021-03-01 LAB — BETA HCG QUANT (REF LAB): hCG Quant: 51 m[IU]/mL

## 2021-03-01 NOTE — Progress Notes (Signed)
Left message for pt return call.

## 2021-03-01 NOTE — Progress Notes (Signed)
Let her know, pregnancy hormone level is coming down close to zero.  Plan Nexplanon soon (need to schedule?).

## 2021-03-02 ENCOUNTER — Telehealth: Payer: Self-pay

## 2021-03-02 NOTE — Telephone Encounter (Signed)
-----   Message from Quintella Baton, [place] sent at 03/02/2021  9:37 AM EDT ----- Can you call pt and set up the appointment for nexplanon. She's aware you will call her today

## 2021-03-02 NOTE — Progress Notes (Signed)
Can you call pt and set up the appointment for nexplanon. She's aware you will call her today

## 2021-03-02 NOTE — Telephone Encounter (Signed)
Patient is scheduled for 03/09/21 with [facility] for nexplanon placement

## 2021-03-03 NOTE — Progress Notes (Signed)
Can you schedule

## 2021-03-07 NOTE — Telephone Encounter (Signed)
Noted. Nexplanon reserved for this patient. 

## 2021-03-09 ENCOUNTER — Ambulatory Visit: Payer: BC Managed Care – PPO | Admitting: Obstetrics & Gynecology

## 2021-03-09 NOTE — Telephone Encounter (Signed)
Patient has rescheduled to Thursday, 03/17/21 at 3:50

## 2021-03-17 ENCOUNTER — Encounter: Payer: Self-pay | Admitting: Obstetrics & Gynecology

## 2021-03-17 ENCOUNTER — Ambulatory Visit (INDEPENDENT_AMBULATORY_CARE_PROVIDER_SITE_OTHER): Payer: BC Managed Care – PPO | Admitting: Obstetrics & Gynecology

## 2021-03-17 ENCOUNTER — Other Ambulatory Visit: Payer: Self-pay

## 2021-03-17 VITALS — BP 100/60 | Ht 68.0 in | Wt 201.0 lb

## 2021-03-17 DIAGNOSIS — Z30017 Encounter for initial prescription of implantable subdermal contraceptive: Secondary | ICD-10-CM

## 2021-03-17 NOTE — Patient Instructions (Signed)
Nexplanon Instructions After Insertion  Keep bandage clean and dry for 24 hours  May use ice/Tylenol/Ibuprofen for soreness or pain  If you develop fever, drainage or increased warmth from incision site-contact office immediately   

## 2021-03-17 NOTE — Progress Notes (Signed)
  Nexplanon Insertion  Patient given informed consent, signed copy in the chart, time out was performed. Appropriate time out taken.  Patient's Left arm was prepped and draped in the usual sterile fashion.. The ruler used to measure and mark insertion area.  Pt was prepped with betadine swab and then injected with 3 cc of 2% lidocaine with epinephrine. Nexplanon removed form packaging,  Device confirmed in needle, then inserted full length of needle and withdrawn per handbook instructions.  Pt insertion site covered with steri-strip and a bandage.   Minimal blood loss.  Pt tolerated the procedure well.   Barnett Applebaum, MD, [facility], [facility] 03/17/2021  3:53 PM

## 2021-05-20 ENCOUNTER — Ambulatory Visit: Payer: BC Managed Care – PPO | Admitting: Obstetrics and Gynecology

## 2021-06-01 DIAGNOSIS — K582 Mixed irritable bowel syndrome: Secondary | ICD-10-CM | POA: Diagnosis not present

## 2021-06-11 DIAGNOSIS — G8929 Other chronic pain: Secondary | ICD-10-CM | POA: Diagnosis not present

## 2021-06-11 DIAGNOSIS — M62838 Other muscle spasm: Secondary | ICD-10-CM | POA: Diagnosis not present

## 2021-06-11 DIAGNOSIS — M25512 Pain in left shoulder: Secondary | ICD-10-CM | POA: Diagnosis not present

## 2021-10-10 NOTE — Telephone Encounter (Signed)
Nexplanon rcvd/charged 03/17/2021

## 2022-03-30 IMAGING — US US PELVIS COMPLETE TRANSABD/TRANSVAG W DUPLEX
1 series · 13 of 25 positions shown · non-contrast
Comparison: CT from earlier in the same day.

CLINICAL DATA: Possible teratoma on recent CT examination

EXAM:
TRANSABDOMINAL AND TRANSVAGINAL ULTRASOUND OF PELVIS
DOPPLER ULTRASOUND OF OVARIES
TECHNIQUE: Both transabdominal and transvaginal ultrasound examinations of the
pelvis were performed. Transabdominal technique was performed for
global imaging of the pelvis including uterus, ovaries, adnexal
regions, and pelvic cul-de-sac.
It was necessary to proceed with endovaginal exam following the
transabdominal exam to visualize the ovaries. Color and duplex
Doppler ultrasound was utilized to evaluate blood flow to the
ovaries.

[Series 1: us pelvic doppler (torsion right/o or mass arteria · arterial · 13 of 104 slices shown]
[im 1/104]
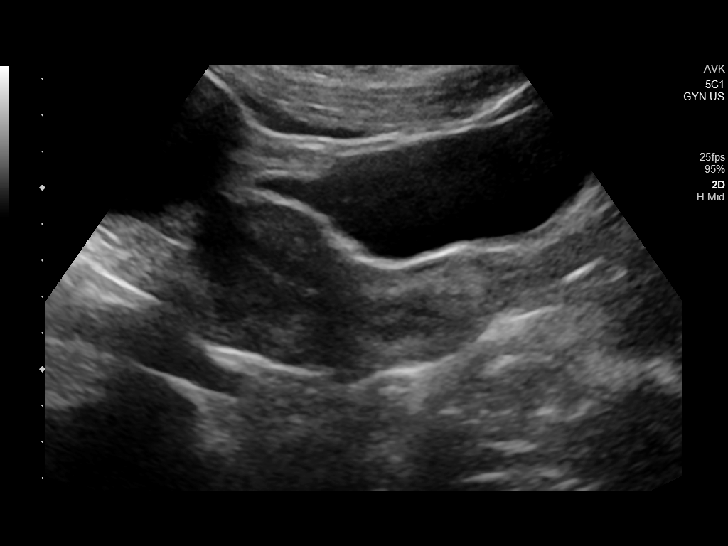
[im 9/104]
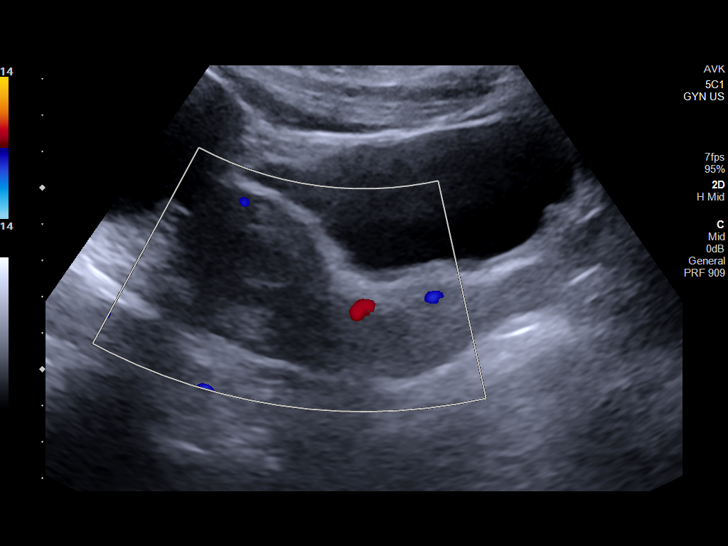
[im 18/104]
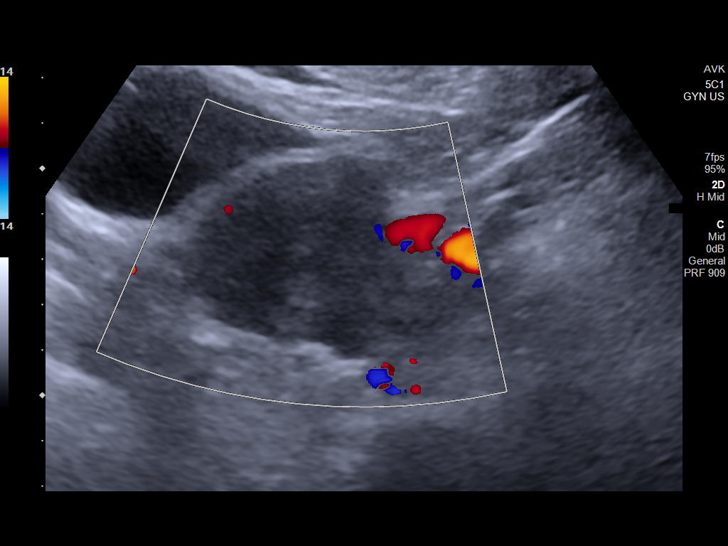
[im 26/104]
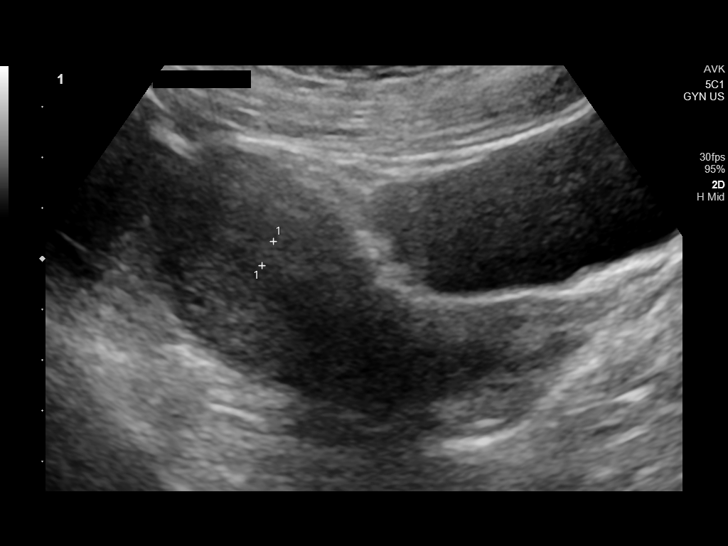
[im 35/104]
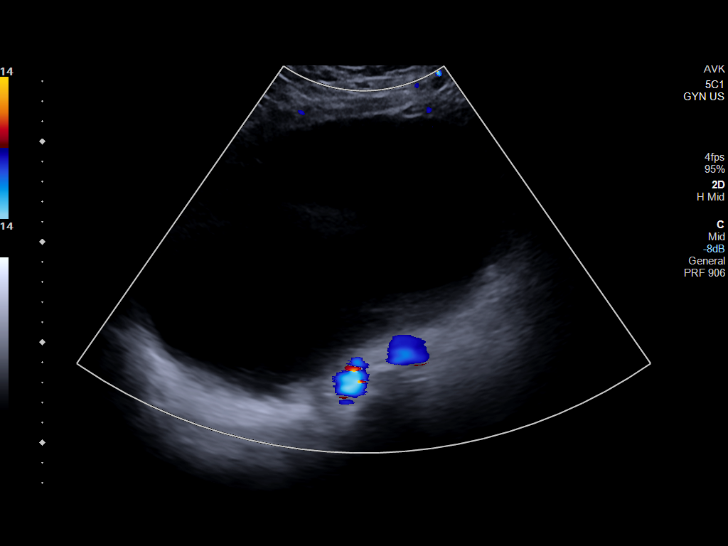
[im 43/104]
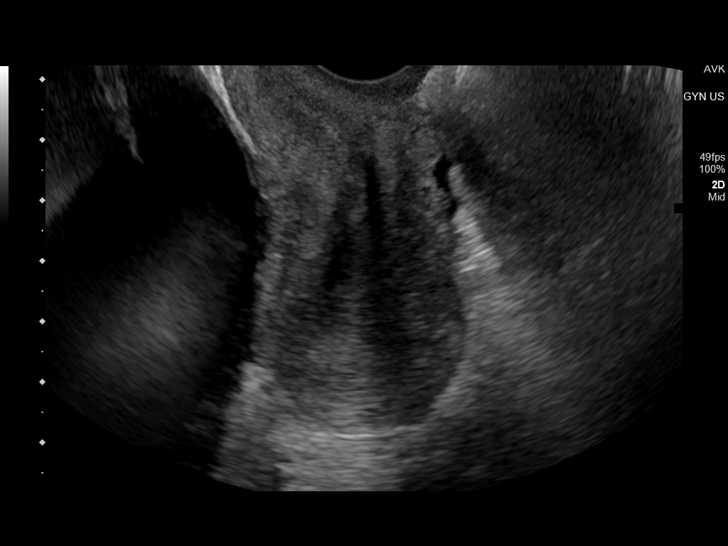
[im 52/104]
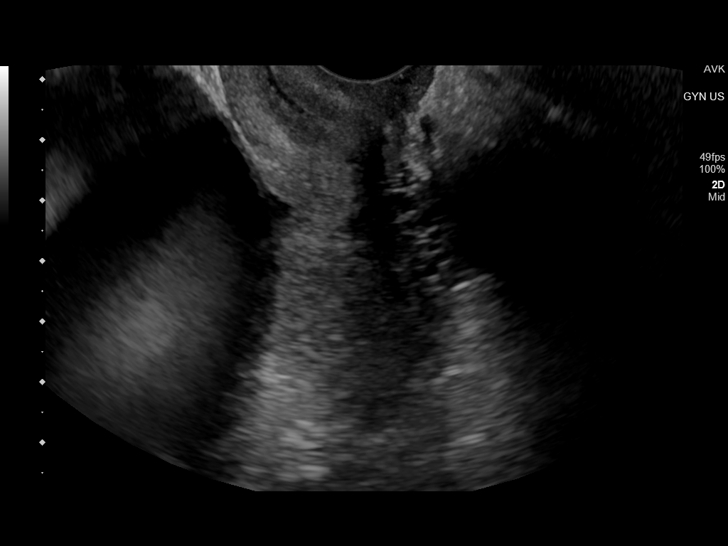
[im 61/104]
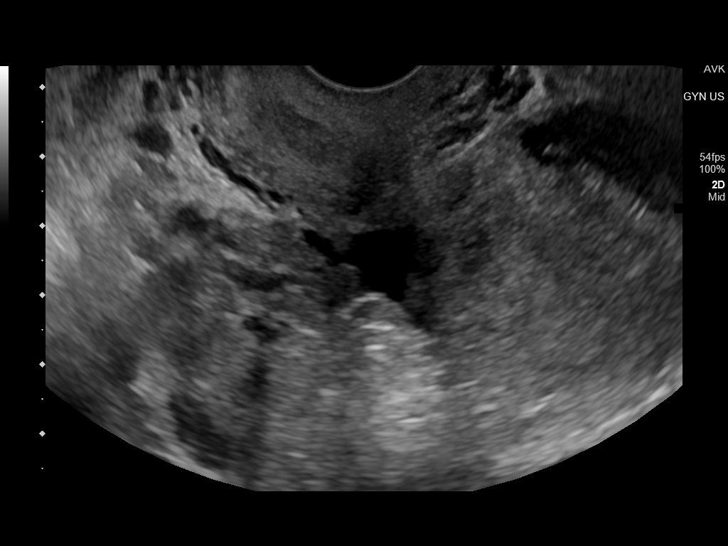
[im 69/104]
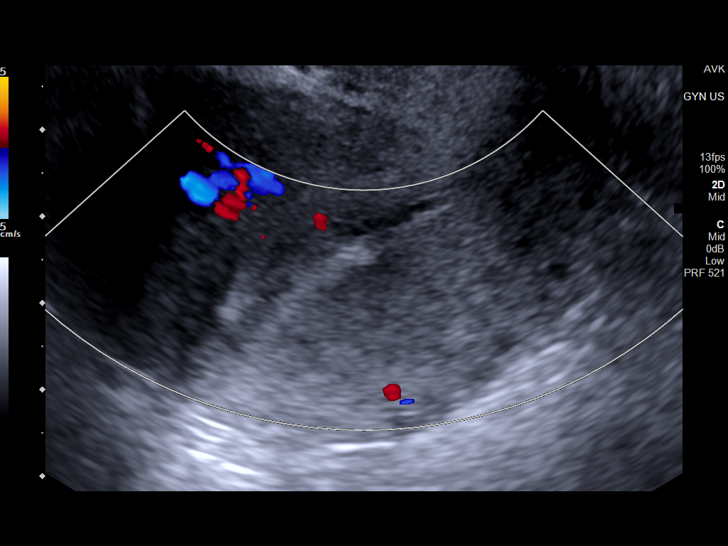
[im 78/104]
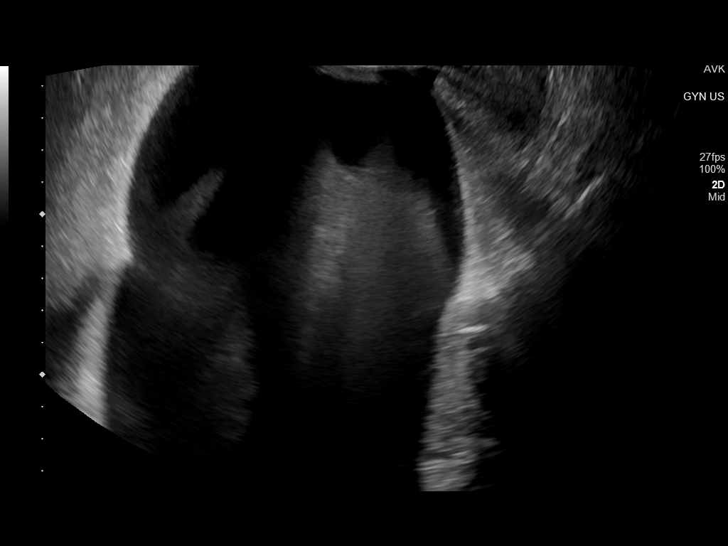
[im 86/104]
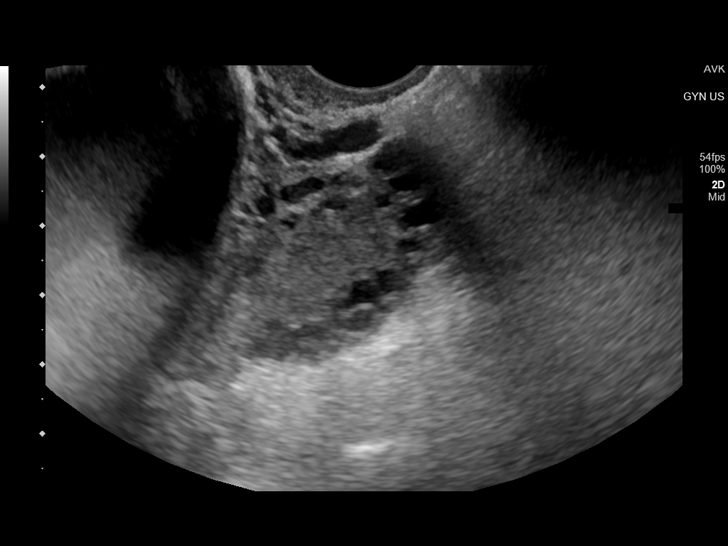
[im 95/104]
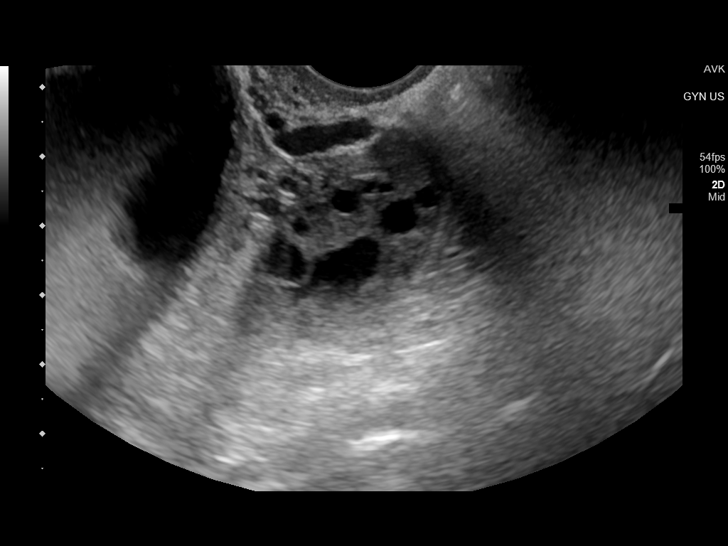
[im 104/104]
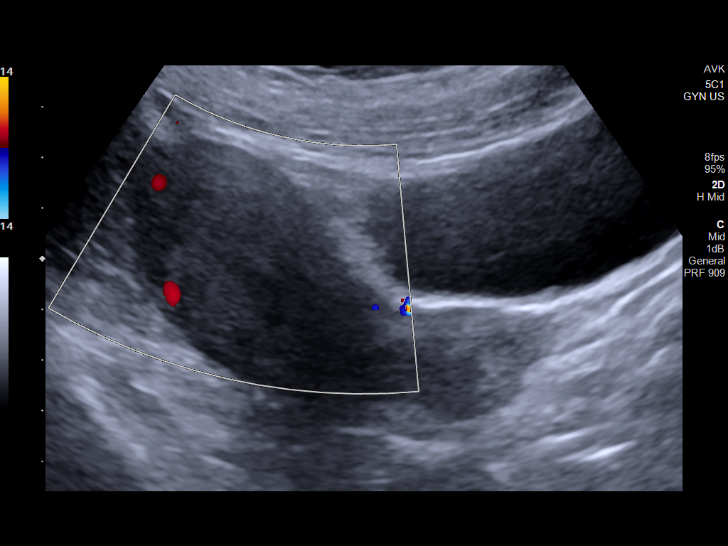

[13 of 25 positions shown; findings below may reference images not displayed]

FINDINGS: Uterus

Measurements: 8.2 x 4.2 x 5.0 cm. = volume: 90 mL. No fibroids or
other mass visualized.

Endometrium

Thickness: 10 mm.  No focal abnormality visualized.

Right ovary

Normal adnexal tissue on the right is not appreciated. Large cystic
mass measuring up to 20 cm is noted predominately simple in nature.
This corresponds to that seen on the prior CT examination. The fatty
and calcific component seen on recent CT are not well appreciated on
today's exam.

Left ovary

Measurements: 3.4 x 2.5 x 2.4 cm. = volume: 11 mL. Normal
appearance/no adnexal mass.

Pulsed Doppler evaluation of both ovaries demonstrates normal
low-resistance arterial and venous waveforms.

Other findings

No abnormal free fluid.
IMPRESSION: Large right adnexal cystic mass similar to that seen on recent CT
examination. The fatty and calcific component seen on CT are not
well appreciated on this exam. The need for nonemergent MRI workup
can be determined on a clinical basis. Surgical consultation is
recommended.

## 2022-03-30 IMAGING — CT CT ABD-PELV W/ CM
2 of 4 series · 15 of 46 positions shown, 17 images · IV contrast (APPLIED)
Comparison: None.

CLINICAL DATA: 24-year-old female with right lower quadrant
abdominal pain.

EXAM:
CT ABDOMEN AND PELVIS WITH CONTRAST
TECHNIQUE: Multidetector CT imaging of the abdomen and pelvis was performed
using the standard protocol following bolus administration of
intravenous contrast.
CONTRAST:  100mL OMNIPAQUE IOHEXOL 300 MG/ML  SOLN

[Series 2: routine abd/pel with · axial · 0.82mm/px · z∈[-471,-1]mm · 12 of 104 slices shown, 14 images]
[im 5/104  soft-tissue]
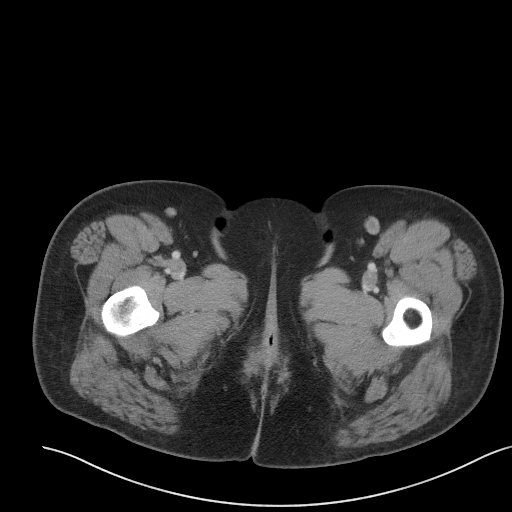
[im 5/104  bone]
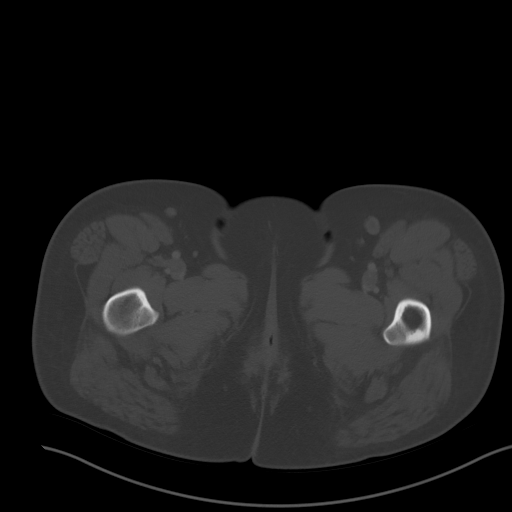
[im 14/104  soft-tissue]
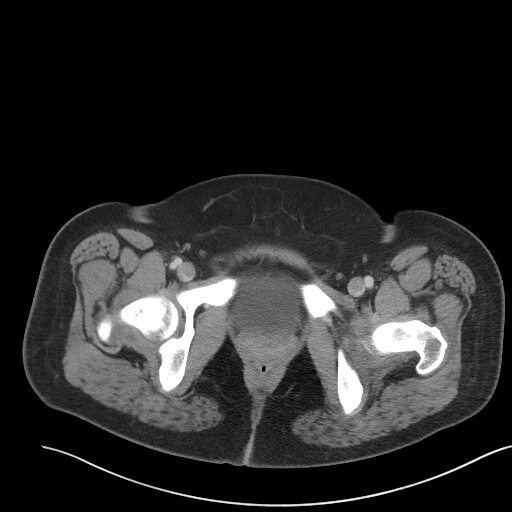
[im 23/104  soft-tissue]
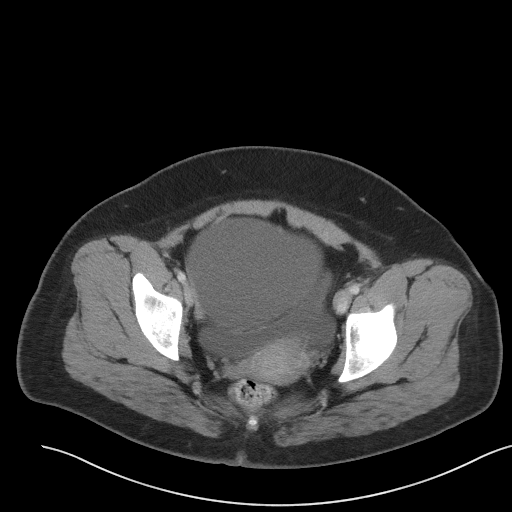
[im 32/104  soft-tissue]
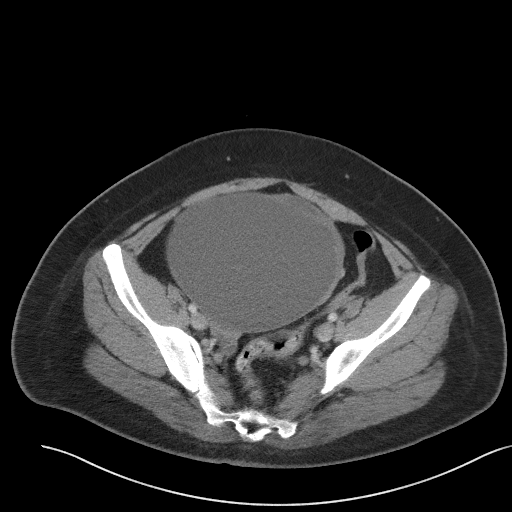
[im 41/104  soft-tissue]
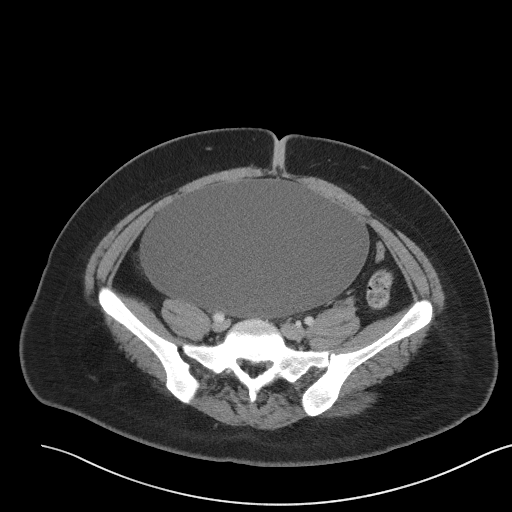
[im 50/104  soft-tissue]
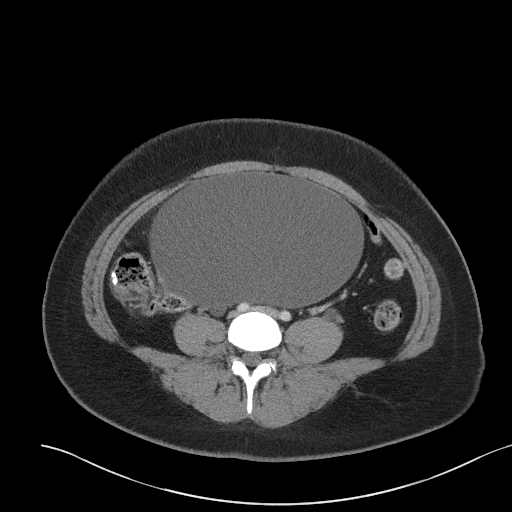
[im 54/104  soft-tissue]
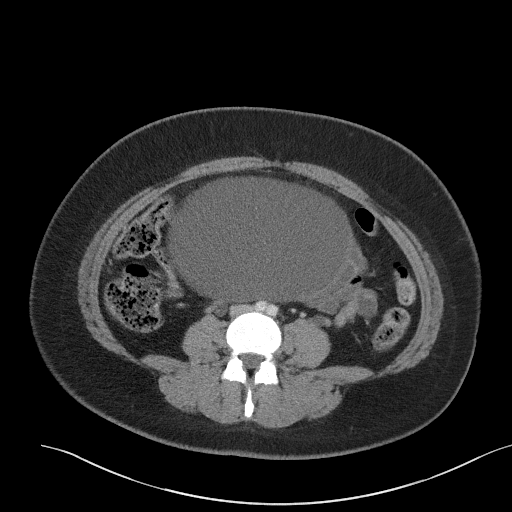
[im 63/104  soft-tissue]
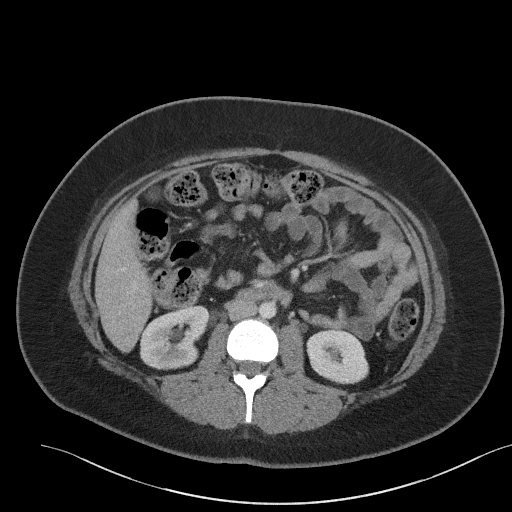
[im 72/104  soft-tissue]
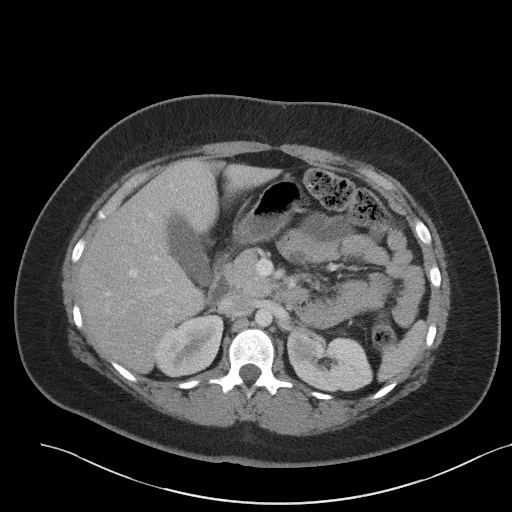
[im 72/104  bone]
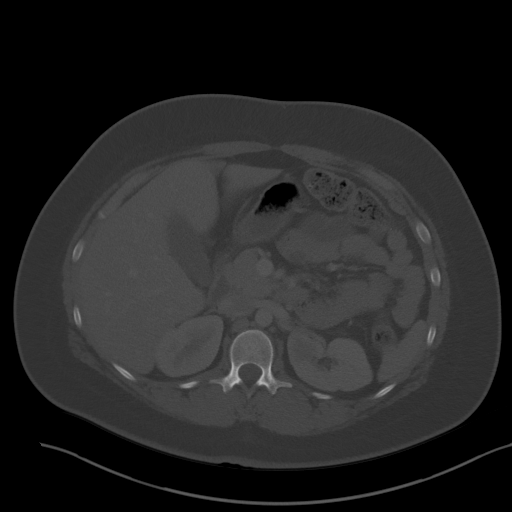
[im 81/104  soft-tissue]
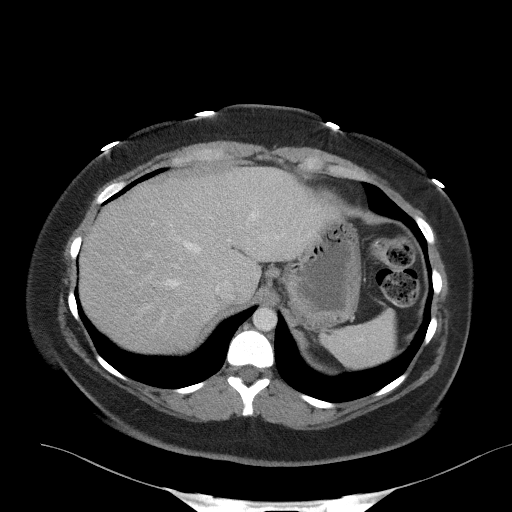
[im 90/104  soft-tissue]
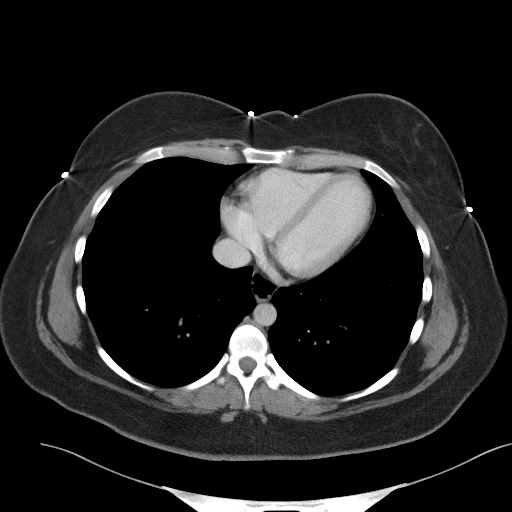
[im 99/104  soft-tissue]
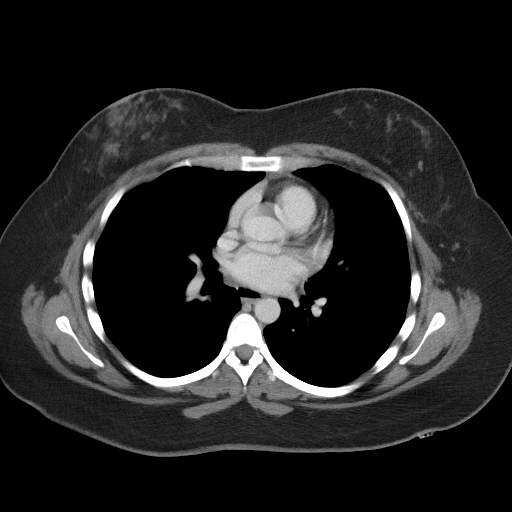

[Series 5: coronal st · coronal · 0.87mm/px · 3 of 97 slices shown]
[im 33/97  soft-tissue]
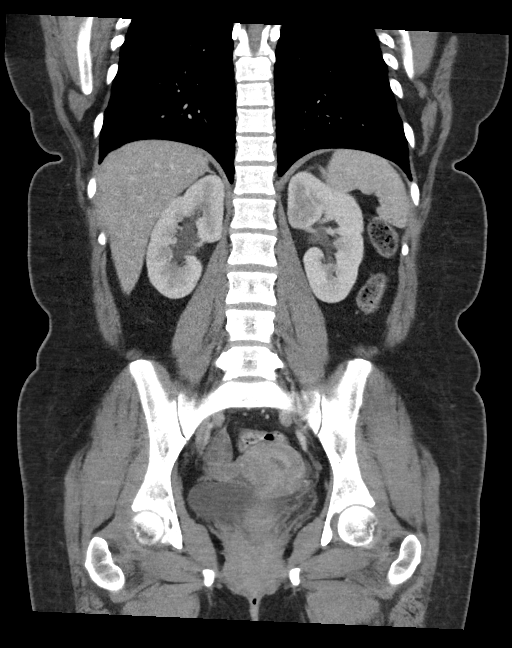
[im 43/97  soft-tissue]
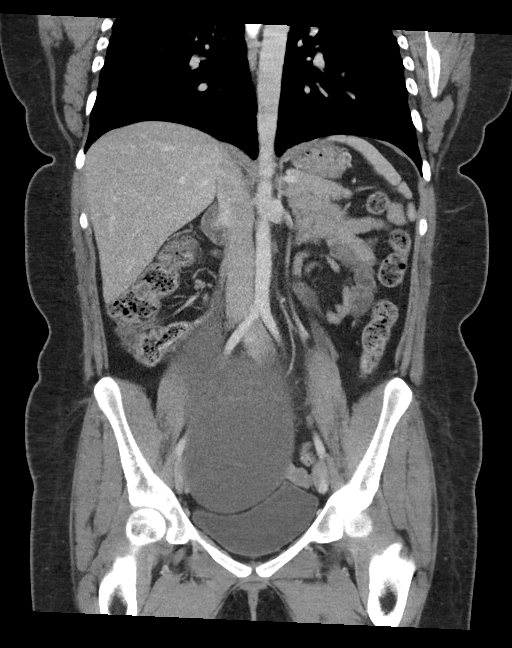
[im 54/97  soft-tissue]
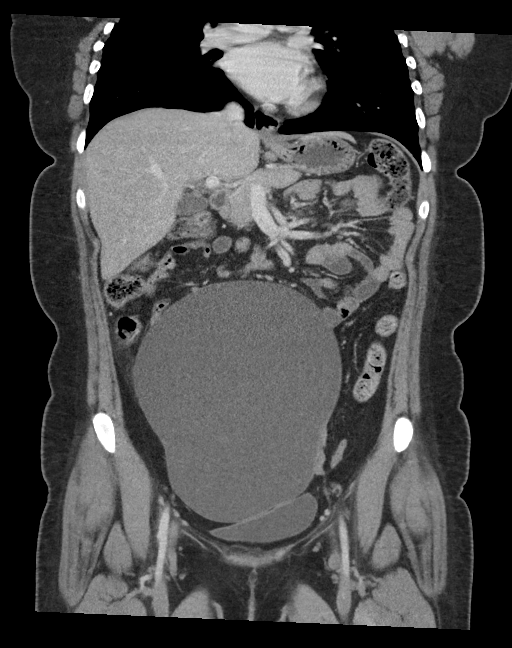

[15 of 46 positions shown; findings below may reference images not displayed]

FINDINGS: Lower chest: The visualized lung bases are clear.

No intra-abdominal free air or free fluid.

Hepatobiliary: Probable mild fatty liver. No intrahepatic biliary
ductal dilatation. The gallbladder is unremarkable.

Pancreas: Unremarkable. No pancreatic ductal dilatation or
surrounding inflammatory changes.

Spleen: Normal in size without focal abnormality.

Adrenals/Urinary Tract: The adrenal glands unremarkable. There is
mild bilateral hydronephrosis likely related to mass effect and
compression of the distal ureters by cystic pelvic mass. The urinary
bladder is unremarkable.

Stomach/Bowel: There is moderate stool throughout the colon. There
is no bowel obstruction or active inflammation. The appendix is
normal.

Vascular/Lymphatic: The abdominal aorta and IVC are unremarkable. No
portal venous gas. There is no adenopathy.

Reproductive: The uterus is grossly unremarkable. There is a large
predominantly cystic mass arising from the pelvis measuring 11 x 19
cm in greatest axial dimensions and 21 cm in craniocaudal length.
There is a small focus of fat and calcification anterior to this
cyst (75/2). This is most consistent with a large cystic teratoma.
Further characterization with MRI without and with contrast is
recommended.

Other: None

Musculoskeletal: Indeterminate 2 x 3 cm sclerotic focus involving
the intertrochanteric ridge of the left femur. No periosteal
elevation or cortical destruction. No associated soft tissue mass.
No acute osseous pathology.
IMPRESSION: 1. Large ovarian cystic teratoma. Further characterization with MRI
without and with contrast is recommended.
2. No bowel obstruction. Normal appendix.
3. Indeterminate 2 x 3 cm sclerotic focus without aggressive
features involving the intertrochanteric ridge of the left femur.
Direct comparison with prior images, if available recommended. If no
prior images are available further characterization with MRI is
advised.
